# Patient Record
Sex: Male | Born: 1950
Health system: Southern US, Community
[De-identification: ages and names within clinical notes are randomized; demographics above are authoritative.]

## PROBLEM LIST (undated history)

## (undated) DIAGNOSIS — K219 Gastro-esophageal reflux disease without esophagitis: Secondary | ICD-10-CM

## (undated) DIAGNOSIS — I10 Essential (primary) hypertension: Secondary | ICD-10-CM

## (undated) DIAGNOSIS — F419 Anxiety disorder, unspecified: Secondary | ICD-10-CM

---

## 2017-07-23 DIAGNOSIS — Z125 Encounter for screening for malignant neoplasm of prostate: Secondary | ICD-10-CM | POA: Diagnosis not present

## 2017-07-23 DIAGNOSIS — I10 Essential (primary) hypertension: Secondary | ICD-10-CM | POA: Diagnosis not present

## 2017-08-31 DIAGNOSIS — R194 Change in bowel habit: Secondary | ICD-10-CM | POA: Diagnosis not present

## 2017-08-31 DIAGNOSIS — R197 Diarrhea, unspecified: Secondary | ICD-10-CM | POA: Diagnosis not present

## 2017-10-13 DIAGNOSIS — M109 Gout, unspecified: Secondary | ICD-10-CM | POA: Diagnosis not present

## 2017-10-13 DIAGNOSIS — K529 Noninfective gastroenteritis and colitis, unspecified: Secondary | ICD-10-CM | POA: Diagnosis not present

## 2017-10-13 DIAGNOSIS — K5289 Other specified noninfective gastroenteritis and colitis: Secondary | ICD-10-CM | POA: Diagnosis not present

## 2017-10-13 DIAGNOSIS — D124 Benign neoplasm of descending colon: Secondary | ICD-10-CM | POA: Diagnosis not present

## 2017-10-13 DIAGNOSIS — F17200 Nicotine dependence, unspecified, uncomplicated: Secondary | ICD-10-CM | POA: Diagnosis not present

## 2017-10-13 DIAGNOSIS — R194 Change in bowel habit: Secondary | ICD-10-CM | POA: Diagnosis not present

## 2017-10-13 DIAGNOSIS — K5730 Diverticulosis of large intestine without perforation or abscess without bleeding: Secondary | ICD-10-CM | POA: Diagnosis not present

## 2017-10-13 DIAGNOSIS — Z79899 Other long term (current) drug therapy: Secondary | ICD-10-CM | POA: Diagnosis not present

## 2017-10-13 DIAGNOSIS — I10 Essential (primary) hypertension: Secondary | ICD-10-CM | POA: Diagnosis not present

## 2018-04-28 ENCOUNTER — Encounter (HOSPITAL_BASED_OUTPATIENT_CLINIC_OR_DEPARTMENT_OTHER): Payer: Self-pay

## 2018-04-28 ENCOUNTER — Inpatient Hospital Stay (HOSPITAL_BASED_OUTPATIENT_CLINIC_OR_DEPARTMENT_OTHER)
Admission: EM | Admit: 2018-04-28 | Discharge: 2018-05-01 | DRG: 281 | Disposition: A | Discharge status: Home / Self Care | Payer: Medicare Other | Attending: Interventional Cardiology | Admitting: Interventional Cardiology

## 2018-04-28 ENCOUNTER — Ambulatory Visit (HOSPITAL_COMMUNITY): Admit: 2018-04-28 | Payer: Self-pay | Admitting: Interventional Cardiology

## 2018-04-28 ENCOUNTER — Inpatient Hospital Stay (HOSPITAL_COMMUNITY)
Admission: EM | Disposition: A | Discharge status: Home / Self Care | Payer: Self-pay | Source: Home / Self Care | Attending: Interventional Cardiology

## 2018-04-28 ENCOUNTER — Emergency Department (HOSPITAL_BASED_OUTPATIENT_CLINIC_OR_DEPARTMENT_OTHER): Payer: Medicare Other

## 2018-04-28 DIAGNOSIS — I2510 Atherosclerotic heart disease of native coronary artery without angina pectoris: Secondary | ICD-10-CM | POA: Diagnosis not present

## 2018-04-28 DIAGNOSIS — F17210 Nicotine dependence, cigarettes, uncomplicated: Secondary | ICD-10-CM | POA: Diagnosis present

## 2018-04-28 DIAGNOSIS — K219 Gastro-esophageal reflux disease without esophagitis: Secondary | ICD-10-CM | POA: Diagnosis present

## 2018-04-28 DIAGNOSIS — I2119 ST elevation (STEMI) myocardial infarction involving other coronary artery of inferior wall: Principal | ICD-10-CM | POA: Diagnosis present

## 2018-04-28 DIAGNOSIS — I472 Ventricular tachycardia: Secondary | ICD-10-CM | POA: Diagnosis not present

## 2018-04-28 DIAGNOSIS — I10 Essential (primary) hypertension: Secondary | ICD-10-CM | POA: Diagnosis not present

## 2018-04-28 DIAGNOSIS — R079 Chest pain, unspecified: Secondary | ICD-10-CM | POA: Diagnosis not present

## 2018-04-28 DIAGNOSIS — F419 Anxiety disorder, unspecified: Secondary | ICD-10-CM | POA: Diagnosis present

## 2018-04-28 DIAGNOSIS — R0602 Shortness of breath: Secondary | ICD-10-CM | POA: Diagnosis not present

## 2018-04-28 DIAGNOSIS — I213 ST elevation (STEMI) myocardial infarction of unspecified site: Secondary | ICD-10-CM

## 2018-04-28 DIAGNOSIS — E785 Hyperlipidemia, unspecified: Secondary | ICD-10-CM

## 2018-04-28 DIAGNOSIS — I441 Atrioventricular block, second degree: Secondary | ICD-10-CM | POA: Diagnosis present

## 2018-04-28 DIAGNOSIS — I340 Nonrheumatic mitral (valve) insufficiency: Secondary | ICD-10-CM | POA: Diagnosis not present

## 2018-04-28 DIAGNOSIS — F17200 Nicotine dependence, unspecified, uncomplicated: Secondary | ICD-10-CM

## 2018-04-28 DIAGNOSIS — Z8249 Family history of ischemic heart disease and other diseases of the circulatory system: Secondary | ICD-10-CM | POA: Diagnosis not present

## 2018-04-28 HISTORY — DX: Anxiety disorder, unspecified: F41.9

## 2018-04-28 HISTORY — DX: Essential (primary) hypertension: I10

## 2018-04-28 HISTORY — DX: Gastro-esophageal reflux disease without esophagitis: K21.9

## 2018-04-28 HISTORY — PX: LEFT HEART CATH AND CORONARY ANGIOGRAPHY: CATH118249

## 2018-04-28 LAB — CBC WITH DIFFERENTIAL/PLATELET
BASOS ABS: 0 10*3/uL (ref 0.0–0.1)
BASOS PCT: 0 %
EOS ABS: 0.2 10*3/uL (ref 0.0–0.7)
EOS PCT: 3 %
HCT: 40.9 % (ref 39.0–52.0)
Hemoglobin: 14.5 g/dL (ref 13.0–17.0)
Lymphocytes Relative: 41 %
Lymphs Abs: 2.8 10*3/uL (ref 0.7–4.0)
MCH: 28.7 pg (ref 26.0–34.0)
MCHC: 35.5 g/dL (ref 30.0–36.0)
MCV: 81 fL (ref 78.0–100.0)
MONO ABS: 0.6 10*3/uL (ref 0.1–1.0)
Monocytes Relative: 9 %
Neutro Abs: 3.1 10*3/uL (ref 1.7–7.7)
Neutrophils Relative %: 47 %
PLATELETS: 243 10*3/uL (ref 150–400)
RBC: 5.05 MIL/uL (ref 4.22–5.81)
RDW: 17.7 % — AB (ref 11.5–15.5)
WBC: 6.7 10*3/uL (ref 4.0–10.5)

## 2018-04-28 LAB — MRSA PCR SCREENING: MRSA by PCR: NEGATIVE

## 2018-04-28 LAB — POCT ACTIVATED CLOTTING TIME
ACTIVATED CLOTTING TIME: 180 s
ACTIVATED CLOTTING TIME: 158 s
Activated Clotting Time: 274 seconds
Activated Clotting Time: 235 seconds

## 2018-04-28 LAB — TROPONIN I
TROPONIN I: 1.05 ng/mL — AB (ref <0.03)
Troponin I: 19.2 ng/mL (ref <0.03)

## 2018-04-28 LAB — BASIC METABOLIC PANEL
Anion gap: 11 (ref 5–15)
BUN: 14 mg/dL (ref 8–23)
CO2: 23 mmol/L (ref 22–32)
Calcium: 9.5 mg/dL (ref 8.9–10.3)
Chloride: 105 mmol/L (ref 98–111)
Creatinine, Ser: 1.1 mg/dL (ref 0.61–1.24)
GFR calc Af Amer: >60 mL/min (ref >60)
GLUCOSE: 115 mg/dL — AB (ref 70–99)
Potassium: 3.7 mmol/L (ref 3.5–5.1)
Sodium: 139 mmol/L (ref 135–145)

## 2018-04-28 LAB — PROTIME-INR
INR: 1.02
PROTHROMBIN TIME: 13.3 s (ref 11.4–15.2)

## 2018-04-28 SURGERY — LEFT HEART CATH AND CORONARY ANGIOGRAPHY
Anesthesia: LOCAL

## 2018-04-28 MED ORDER — HEPARIN (PORCINE) IN NACL 100-0.45 UNIT/ML-% IJ SOLN: 800.0000 [IU]/h | INTRAMUSCULAR | Status: DC

## 2018-04-28 MED ORDER — ASPIRIN 81 MG PO CHEW
324.0000 mg | CHEWABLE_TABLET | Freq: Once | ORAL | Status: AC
  Administered 2018-04-28: 324 mg via ORAL
  Filled 2018-04-28: qty 4

## 2018-04-28 MED ORDER — SODIUM CHLORIDE 0.9 % IV SOLN: 250.0000 mL | INTRAVENOUS | Status: DC | PRN

## 2018-04-28 MED ORDER — CITALOPRAM HYDROBROMIDE 10 MG PO TABS
10.0000 mg | ORAL_TABLET | Freq: Every day | ORAL | Status: DC
  Administered 2018-04-28 – 2018-05-01 (×4): 10 mg via ORAL
  Filled 2018-04-28 (×4): qty 1

## 2018-04-28 MED ORDER — ASPIRIN 81 MG PO CHEW
81.0000 mg | CHEWABLE_TABLET | Freq: Every day | ORAL | Status: DC
  Administered 2018-04-29 – 2018-05-01 (×3): 81 mg via ORAL
  Filled 2018-04-28 (×3): qty 1

## 2018-04-28 MED ORDER — FENTANYL CITRATE (PF) 100 MCG/2ML IJ SOLN
50.0000 ug | Freq: Once | INTRAMUSCULAR | Status: AC
  Administered 2018-04-28: 50 ug via INTRAVENOUS

## 2018-04-28 MED ORDER — HEPARIN (PORCINE) IN NACL 1000-0.9 UT/500ML-% IV SOLN
INTRAVENOUS | Status: DC | PRN
  Administered 2018-04-28 (×3): 500 mL

## 2018-04-28 MED ORDER — HEPARIN SODIUM (PORCINE) 5000 UNIT/ML IJ SOLN
INTRAMUSCULAR | Status: AC
  Filled 2018-04-28: qty 1

## 2018-04-28 MED ORDER — POTASSIUM CHLORIDE CRYS ER 20 MEQ PO TBCR
40.0000 meq | EXTENDED_RELEASE_TABLET | Freq: Once | ORAL | Status: AC
  Administered 2018-04-28: 40 meq via ORAL
  Filled 2018-04-28: qty 2

## 2018-04-28 MED ORDER — LIDOCAINE HCL (PF) 1 % IJ SOLN
INTRAMUSCULAR | Status: DC | PRN
  Administered 2018-04-28: 20 mL via INTRADERMAL
  Administered 2018-04-28: 2 mL via INTRADERMAL

## 2018-04-28 MED ORDER — METOPROLOL TARTRATE 12.5 MG HALF TABLET
12.5000 mg | ORAL_TABLET | Freq: Two times a day (BID) | ORAL | Status: DC
  Administered 2018-04-30 – 2018-05-01 (×3): 12.5 mg via ORAL
  Filled 2018-04-28 (×4): qty 1

## 2018-04-28 MED ORDER — FENTANYL CITRATE (PF) 100 MCG/2ML IJ SOLN
INTRAMUSCULAR | Status: AC
Start: 2018-04-28 — End: 2018-04-28
  Filled 2018-04-28: qty 2

## 2018-04-28 MED ORDER — CLOPIDOGREL BISULFATE 75 MG PO TABS
75.0000 mg | ORAL_TABLET | Freq: Every day | ORAL | Status: DC
  Administered 2018-04-29 – 2018-05-01 (×3): 75 mg via ORAL
  Filled 2018-04-28 (×3): qty 1

## 2018-04-28 MED ORDER — NITROGLYCERIN 0.4 MG SL SUBL
SUBLINGUAL_TABLET | SUBLINGUAL | Status: AC
  Administered 2018-04-28: 0.4 mg via SUBLINGUAL
  Filled 2018-04-28: qty 3

## 2018-04-28 MED ORDER — ACETAMINOPHEN 325 MG PO TABS: 650.0000 mg | ORAL_TABLET | ORAL | Status: DC | PRN

## 2018-04-28 MED ORDER — VERAPAMIL HCL 2.5 MG/ML IV SOLN
INTRAVENOUS | Status: DC | PRN
  Administered 2018-04-28: 10 mL via INTRA_ARTERIAL

## 2018-04-28 MED ORDER — SODIUM CHLORIDE 0.9% FLUSH
3.0000 mL | INTRAVENOUS | Status: DC | PRN
  Administered 2018-04-28: 3 mL via INTRAVENOUS
  Filled 2018-04-28: qty 3

## 2018-04-28 MED ORDER — HEPARIN (PORCINE) IN NACL 100-0.45 UNIT/ML-% IJ SOLN
INTRAMUSCULAR | Status: AC
  Filled 2018-04-28: qty 250

## 2018-04-28 MED ORDER — SODIUM CHLORIDE 0.9 % IV SOLN
INTRAVENOUS | Status: AC
  Administered 2018-04-28: 16:00:00 via INTRAVENOUS

## 2018-04-28 MED ORDER — SODIUM CHLORIDE 0.9 % IV BOLUS
500.0000 mL | Freq: Once | INTRAVENOUS | Status: AC
  Administered 2018-04-28: 500 mL via INTRAVENOUS

## 2018-04-28 MED ORDER — LABETALOL HCL 5 MG/ML IV SOLN: 10.0000 mg | INTRAVENOUS | Status: AC | PRN

## 2018-04-28 MED ORDER — HYDRALAZINE HCL 20 MG/ML IJ SOLN: 5.0000 mg | INTRAMUSCULAR | Status: DC | PRN

## 2018-04-28 MED ORDER — FOLIC ACID 1 MG PO TABS
1.0000 mg | ORAL_TABLET | Freq: Every day | ORAL | Status: DC
  Administered 2018-04-28 – 2018-05-01 (×4): 1 mg via ORAL
  Filled 2018-04-28 (×4): qty 1

## 2018-04-28 MED ORDER — VITAMIN B-12 1000 MCG PO TABS
1000.0000 ug | ORAL_TABLET | Freq: Every day | ORAL | Status: DC
  Administered 2018-04-28 – 2018-05-01 (×4): 1000 ug via ORAL
  Filled 2018-04-28 (×4): qty 1

## 2018-04-28 MED ORDER — IOPAMIDOL (ISOVUE-370) INJECTION 76%
INTRAVENOUS | Status: DC | PRN
  Administered 2018-04-28: 125 mL

## 2018-04-28 MED ORDER — SODIUM CHLORIDE 0.9% FLUSH
3.0000 mL | Freq: Two times a day (BID) | INTRAVENOUS | Status: DC
  Administered 2018-04-28 – 2018-05-01 (×6): 3 mL via INTRAVENOUS

## 2018-04-28 MED ORDER — CLOPIDOGREL BISULFATE 300 MG PO TABS
ORAL_TABLET | ORAL | Status: DC | PRN
  Administered 2018-04-28: 300 mg via ORAL

## 2018-04-28 MED ORDER — HEPARIN SODIUM (PORCINE) 5000 UNIT/ML IJ SOLN
60.0000 [IU]/kg | Freq: Once | INTRAMUSCULAR | Status: AC
  Administered 2018-04-28: 3950 [IU] via INTRAVENOUS

## 2018-04-28 MED ORDER — HEPARIN (PORCINE) IN NACL 100-0.45 UNIT/ML-% IJ SOLN
12.0000 [IU]/kg/h | INTRAMUSCULAR | Status: DC
  Administered 2018-04-28: 12 [IU]/kg/h via INTRAVENOUS

## 2018-04-28 MED ORDER — ATORVASTATIN CALCIUM 80 MG PO TABS
80.0000 mg | ORAL_TABLET | Freq: Every day | ORAL | Status: DC
  Administered 2018-04-28 – 2018-04-30 (×3): 80 mg via ORAL
  Filled 2018-04-28 (×3): qty 1

## 2018-04-28 MED ORDER — ONDANSETRON HCL 4 MG/2ML IJ SOLN: 4.0000 mg | Freq: Four times a day (QID) | INTRAMUSCULAR | Status: DC | PRN

## 2018-04-28 MED ORDER — HYDRALAZINE HCL 20 MG/ML IJ SOLN: 10.0000 mg | INTRAMUSCULAR | Status: DC | PRN

## 2018-04-28 MED ORDER — HEPARIN SODIUM (PORCINE) 1000 UNIT/ML IJ SOLN
INTRAMUSCULAR | Status: DC | PRN
  Administered 2018-04-28: 7000 [IU] via INTRAVENOUS

## 2018-04-28 MED ORDER — NITROGLYCERIN 0.4 MG SL SUBL
0.4000 mg | SUBLINGUAL_TABLET | SUBLINGUAL | Status: DC | PRN
  Administered 2018-04-28 (×3): 0.4 mg via SUBLINGUAL

## 2018-04-28 MED ORDER — IOHEXOL 350 MG/ML SOLN
INTRAVENOUS | Status: DC | PRN
  Administered 2018-04-28: 160 mL

## 2018-04-28 MED ORDER — HEPARIN (PORCINE) IN NACL 100-0.45 UNIT/ML-% IJ SOLN
1150.0000 [IU]/h | INTRAMUSCULAR | Status: DC
  Administered 2018-04-28: 800 [IU]/h via INTRAVENOUS
  Administered 2018-04-29: 1150 [IU]/h via INTRAVENOUS
  Filled 2018-04-28 (×2): qty 250

## 2018-04-28 MED ORDER — HEPARIN SODIUM (PORCINE) 5000 UNIT/ML IJ SOLN: 60.0000 [IU]/kg | Freq: Once | INTRAMUSCULAR | Status: DC

## 2018-04-28 MED ORDER — MIDAZOLAM HCL 2 MG/2ML IJ SOLN
INTRAMUSCULAR | Status: DC | PRN
  Administered 2018-04-28: 1 mg via INTRAVENOUS

## 2018-04-28 SURGICAL SUPPLY — 26 items
CATH 5FR JL3.5 JR4 ANG PIG MP (CATHETERS) ×1 IMPLANT
CATH LAUNCHER 6FR AL1 (CATHETERS) ×1 IMPLANT
CATH LAUNCHER 6FR AL2 (CATHETERS) ×1 IMPLANT
CATH LAUNCHER 6FR AR1 (CATHETERS) ×2 IMPLANT
CATH VISTA GUIDE 6FR JR4 (CATHETERS) ×1 IMPLANT
CATH VISTA GUIDE 6FR XBRCA (CATHETERS) ×1 IMPLANT
CATHETER LAUNCHER 6FR AL1 (CATHETERS) ×2
CATHETER LAUNCHER 6FR AL2 (CATHETERS) ×2
CATHETER LAUNCHER 6FR MP1 (CATHETERS) ×2 IMPLANT
DEVICE RAD COMP TR BAND LRG (VASCULAR PRODUCTS) ×2 IMPLANT
GLIDESHEATH SLEND SS 6F .021 (SHEATH) ×1 IMPLANT
GUIDEWIRE INQWIRE 1.5J.035X260 (WIRE) ×1 IMPLANT
INQWIRE 1.5J .035X260CM (WIRE) ×2
KIT ENCORE 26 ADVANTAGE (KITS) ×1 IMPLANT
KIT HEART LEFT (KITS) ×2 IMPLANT
KIT HEMO VALVE WATCHDOG (MISCELLANEOUS) ×2 IMPLANT
PACK CARDIAC CATHETERIZATION (CUSTOM PROCEDURE TRAY) ×2 IMPLANT
SHEATH PINNACLE 6F 10CM (SHEATH) ×1 IMPLANT
SHEATH PROBE COVER 6X72 (BAG) ×2 IMPLANT
SYR MEDRAD MARK V 150ML (SYRINGE) ×2 IMPLANT
TRANSDUCER W/STOPCOCK (MISCELLANEOUS) ×2 IMPLANT
TUBING CIL FLEX 10 FLL-RA (TUBING) ×2 IMPLANT
WIRE ASAHI PROWATER 180CM (WIRE) ×1 IMPLANT
WIRE ASAHI PROWATER 300CM (WIRE) IMPLANT
WIRE EMERALD 3MM-J .035X150CM (WIRE) ×1 IMPLANT
WIRE HI TORQ VERSACORE-J 145CM (WIRE) ×1 IMPLANT

## 2018-04-28 NOTE — Progress Notes (Signed)
ANTICOAGULATION CONSULT NOTE - Initial Consult  Pharmacy Consult for heparin Indication: chest pain/ACS  No Known Allergies  Patient Measurements: Weight: 145 lb (65.8 kg) Heparin Dosing Weight:   Vital Signs: BP: 124/87 (07/24 0901) Pulse Rate: 67 (07/24 0901)  Labs: Recent Labs    04/28/18 0855  HGB 14.5  HCT 40.9  PLT 243    CrCl cannot be calculated (No order found.).   Medical History: Past Medical History:  Diagnosis Date  . Acid reflux   . Hypertension     Medications:  Scheduled:  . heparin      . heparin  60 Units/kg Intravenous Once    Assessment: 57 YOM presenting with CP to [place], code STEMI called and pharmacy consulted for heparin.  No known anticoagulation PTA, CBC wnl and no bleeding reported.   Goal of Therapy:  Heparin level 0.3-0.7 units/ml Monitor platelets by anticoagulation protocol: Yes   Plan:  Initiate heparin 800 units/hr IV (3950 units IV bolus given in ED) F/u heparin level at 1500 F/u cath/duration  Monitor daily heparin level, CBC, s/s bleeding  Bertis Ruddy 04/28/2018,9:07 AM

## 2018-04-28 NOTE — ED Notes (Signed)
Pt transported to [facility] cath lab via EMS and [place]; pt a/o x4 denies pain at this

## 2018-04-28 NOTE — ED Notes (Signed)
Pain 6/10

## 2018-04-28 NOTE — ED Triage Notes (Signed)
Pt states moving limbs about 51mins ago and developed center chest pain with SOB, head and hands claming and vomit x1

## 2018-04-28 NOTE — Progress Notes (Signed)
Notified Barrett PA of patient's diastolic pressure >507. PRN hydralazine ordered.

## 2018-04-28 NOTE — H&P (Addendum)
Cardiology Admission History and Physical:   Patient ID: Darius Patrick; MRN: [id number]; DOB: 06/16/1951   Admission date: 04/28/2018  Primary Care Provider: Patient, No Pcp Per Primary Cardiologist: Larae Grooms, MD  Primary Electrophysiologist:  n/c  Chief Complaint:  STEMI  Patient Profile:   Darius Patrick is a 67 y.o. male with a history of HTN, anxiety, was living in [place], [place] till about 2 weeks ago. He is moving to [place], to be closer to family.   History of Present Illness:   [name] developed chest pain about 8 am today. It started with moderate exertion. It was 10/10 and associated with SOB and N&V. Brother took him to [facility] and they transferred him to [facility] for an inferior STEMI.   He was given ASA and heparin, transferred urgently to [facility] and taken directly to the cath lab.   In the Cath Lab, his chest pain completely resolved.  He has never had chest pain like this before.  His activity level has been higher than usual because of moving.  He has generally not felt well at times, but does not describe any specific exertional symptoms.  He has no history of palpitations, orthopnea, lower extremity edema or PND.  He continues to smoke about 1/2 pack a day.  He has dyspnea on exertion that has not changed recently.  He used to drink heavily, 1/5 of liquor a day.  He tapered himself back starting in January and his alcohol use is greatly decreased from what it was.  At first, he tried to quit cold [place] and developed DTs.  He started back drinking and then began cutting back gradually.  He has no family physician, has not had a checkup in several years.   Past Medical History:  Diagnosis Date  . Acid reflux   . Anxiety   . Hypertension     Past Surgical History:  Procedure Laterality Date  . LEFT HEART CATH AND CORONARY ANGIOGRAPHY N/A 04/28/2018   Procedure: LEFT HEART CATH AND CORONARY ANGIOGRAPHY;  Surgeon: Jettie Booze, MD;  Location: [facility];  Service: Cardiovascular;  Laterality: N/A;     Medications Prior to Admission: Prior to Admission medications   Medication Sig Start Date End Date Taking? Authorizing Provider  citalopram (CELEXA) 10 MG tablet Take 10 mg by mouth daily.   Yes [provider]  folic acid (FOLVITE) 1 MG tablet Take 1 mg by mouth daily.   Yes [provider]  traZODone (DESYREL) 100 MG tablet Take 100-200 mg by mouth at bedtime.   Yes [provider]  vitamin B-12 (CYANOCOBALAMIN) 1000 MCG tablet Take 1,000 mcg by mouth daily.   Yes [provider]     Allergies:   No Known Allergies  Social History:   Social History   Socioeconomic History  . Marital status: Divorced    Spouse name: Not on file  . Number of children: Not on file  . Years of education: Not on file  . Highest education level: Not on file  Occupational History  . Occupation: Retired Forensic scientist  . Financial resource strain: Not on file  . Food insecurity:    Worry: Not on file    Inability: Not on file  . Transportation needs:    Medical: Not on file    Non-medical: Not on file  Tobacco Use  . Smoking status: Current Every Day Smoker    Packs/day: 0.50  . Smokeless tobacco:  Never Used  Substance and Sexual Activity  . Alcohol use: Yes    Alcohol/week: 3.6 oz    Types: 3 Cans of beer, 3 Glasses of wine per week    Comment: a few beers and a pint of wine a week.  . Drug use: Not Currently    Comment: not since the 70s  . Sexual activity: Not on file  Lifestyle  . Physical activity:    Days per week: Not on file    Minutes per session: Not on file  . Stress: Not on file  Relationships  . Social connections:    Talks on phone: Not on file    Gets together: Not on file    Attends religious service: Not on file    Active member of club or organization: Not on file    Attends meetings of clubs or organizations: Not on file    Relationship status: Not  on file  . Intimate partner violence:    Fear of current or ex partner: Not on file    Emotionally abused: Not on file    Physically abused: Not on file    Forced sexual activity: Not on file  Other Topics Concern  . Not on file  Social History Narrative   Pt lives alone in [place], [place].     Family History:   The patient's family history includes Heart attack (age of onset: 33) in his father; Heart disease in his brother; Hypertension in his sister; Hypertension (age of onset: 25) in his mother.    The patient indicated that his mother is deceased. He indicated that his father is deceased. He indicated that his sister is alive. He indicated that both of his brothers are alive.   ROS:  Please see the history of present illness.  All other ROS reviewed and negative.     Physical Exam/Data:   Vitals:   04/28/18 1615 04/28/18 1630 04/28/18 1700 04/28/18 1800  BP:   (!) 137/105 120/73  Pulse: 60  (!) 56   Resp: 14 18 10 20   Temp:      TempSrc:      SpO2: 98%  99%   Weight:        Intake/Output Summary (Last 24 hours) at 04/28/2018 1820 Last data filed at 04/28/2018 1200 Gross per 24 hour  Intake -  Output 600 ml  Net -600 ml   Filed Weights   04/28/18 0858  Weight: 145 lb (65.8 kg)   There is no height or weight on file to calculate BMI.  General:  Well nourished, well developed, in no acute distress HEENT: normal except for disconjugate gaze Lymph: no adenopathy Neck: no JVD Endocrine:  No thryomegaly Vascular: No carotid bruits; FA pulses 2+ bilaterally without bruits  Cardiac:  normal S1, S2; RRR; no murmur  Lungs: Slight expiratory wheeze bilaterally, no rhonchi or rales  Abd: soft, nontender, no hepatomegaly  Ext: no edema, right groin cath site and right radial cath site are without ecchymosis or hematoma Musculoskeletal:  No deformities, BUE and BLE strength normal and equal Skin: warm and dry  Neuro:  CNs 2-12 intact, no focal abnormalities noted Psych:   Normal affect    EKG:  The ECG that was done today was personally reviewed and demonstrates sinus rhythm, heart rate 83, inferior ST elevation and lateral T wave changes  Relevant CV Studies: Echo, ordered  Heart Cath 04/28/2018  Occluded RCA, not selectively visualized. Heavily calcified and could not be engaged.  Left to right collaterals.  Mild to moderate heavily calcific CAD in the left system.  LV end diastolic pressure is normal.  There is no aortic valve stenosis.  Thoracic aorta appeared to show high takeoff of RCA. No dissection noted.  Severe tortuosity of the right subclavian artery with moderate atherosclerosis noted.  Recommend uninterrupted dual antiplatelet therapy with Aspirin 81mg  daily and Clopidogrel 75mg  daily for a minimum of 12 months (ACS - Class I recommendation).   Start beta blocker, statin along with aggressive secondary prevention.    Watch in ICU.  Heparin for 24-48 hours after sheath pull.  Laboratory Data:  Chemistry Recent Labs  Lab 04/28/18 0855  NA 139  K 3.7  CL 105  CO2 23  GLUCOSE 115*  BUN 14  CREATININE 1.10  CALCIUM 9.5  GFRNONAA >60  GFRAA >60  ANIONGAP 11    Hematology Recent Labs  Lab 04/28/18 0855  WBC 6.7  RBC 5.05  HGB 14.5  HCT 40.9  MCV 81.0  MCH 28.7  MCHC 35.5  RDW 17.7*  PLT 243   Cardiac Enzymes Recent Labs  Lab 04/28/18 0855 04/28/18 1456  TROPONINI 1.05* 19.20*   Radiology/Studies:  Dg Chest Portable 1 View  Result Date: 04/28/2018 CLINICAL DATA:  Chest pain, shortness of breath, diaphoresis and single episode of vomiting. EXAM: PORTABLE CHEST 1 VIEW COMPARISON:  Right shoulder dated 10/27/2011. FINDINGS: Normal sized heart. Tortuous aorta. Minimal linear density in both lower lung zones. Mildly prominent pulmonary vasculature and interstitial markings. Minimal dextroconvex thoracic scoliosis. Thoracic spine degenerative changes and minimal bilateral glenohumeral joint degenerative changes.  Old, healed right 6th rib fracture. IMPRESSION: 1. Mild pulmonary vascular congestion. 2. Mild interstitial lung disease, most likely chronic. 3. Minimal bibasilar linear atelectasis or scarring. Electronically Signed   By: Claudie Revering M.D.   On: 04/28/2018 09:19    Assessment and Plan:   1. Inferior STEMI: He was taken to the Cath Lab and had an occluded RCA, could not be crossed with a wire and medical therapy is recommended.  He is on aspirin, Plavix, currently on heparin.  He was started on a beta-blocker, but the initial dose was held because of a heart rate in the 50s.  He is on high-dose statin.  An echocardiogram is ordered. 2. Hypertension: His blood pressure is currently well controlled.  Beta-blocker has been added, hopefully will be able to give a dose tonight.  If not, consider ACE inhibitor or ARB tomorrow 3. Unknown lipid status: He has been started on high-dose statin.  Check LFTs and lipid profile tomorrow 4. Tobacco use: Cessation advised   Severity of Illness: The appropriate patient status for this patient is INPATIENT. Inpatient status is judged to be reasonable and necessary in order to provide the required intensity of service to ensure the patient's safety. The patient's presenting symptoms, physical exam findings, and initial radiographic and laboratory data in the context of their chronic comorbidities is felt to place them at high risk for further clinical deterioration. Furthermore, it is not anticipated that the patient will be medically stable for discharge from the hospital within 2 midnights of admission. The following factors support the patient status of inpatient.   " The patient's presenting symptoms include chest pain. " The worrisome physical exam findings include abnormal ECG. " The initial radiographic and laboratory data are worrisome because of occluded RCA. " The chronic co-morbidities include hypertension.   * I certify that at the point of admission  it is  my clinical judgment that the patient will require inpatient hospital care spanning beyond 2 midnights from the point of admission due to high intensity of service, high risk for further deterioration and high frequency of surveillance required.*    For questions or updates, please contact [place] Please consult [email or web address] for contact info under Cardiology/STEMI.    Signed, Rosaria Ferries, PA-C  04/28/2018 6:20 PM   I have examined the patient and reviewed assessment and plan and discussed with patient.  Agree with above as stated.  I personally made the decision for emergency cardiac catheterization.  Cardiac cath revealed left to right collaterals.  It was suspected that he had an occluded right coronary artery.  Unfortunately, we are unable to engage the RCA despite trying multiple guide catheters.  He had a heavily calcified RCA.  He was pain-free at the end of the procedure.  Will continue medical therapy.  Larae Grooms

## 2018-04-28 NOTE — ED Provider Notes (Signed)
[facility] EMERGENCY DEPARTMENT Provider Note   CSN: [id number] Arrival date & time: 04/28/18  0831     History   Chief Complaint Chief Complaint  Patient presents with  . Chest Pain    HPI Darius Patrick is a 67 y.o. male.  67 year old male with past medical history including anxiety, tobacco use who presents with chest pain.  Just prior to arrival, he was cutting up branches of a fallen tree when he suddenly began having central chest pressure that became severe and was associated with shortness of breath, vomiting, and diaphoresis.  His pain is still present but has eased off a bit, currently 7/10 in intensity.  Pain is nonradiating and he has never had anything like this before.  No recent long travel.  He denies any cardiac history.  Family history notable for father with MI in his 62s.  Patient denies any drug use, problems with bleeding, black or bloody stools.  The history is provided by the patient.  Chest Pain      Past Medical History:  Diagnosis Date  . Acid reflux   . Hypertension     There are no active problems to display for this patient.  PMH: Anxiety  PSH: Denies    Home Medications    Prior to Admission medications   Not on File    Family History No family history on file. Father- died MI in his 23s  Social History Social History   Tobacco Use  . Smoking status: Never Smoker  . Smokeless tobacco: Never Used  Substance Use Topics  . Alcohol use: Yes  . Drug use: Not Currently  1/2PPD smoking 2 beers every other day Denies drug use Lived in [place], moving here to [place] full time   Allergies   Patient has no known allergies.   Review of Systems Review of Systems  Cardiovascular: Positive for chest pain.   All other systems reviewed and are negative except that which was mentioned in HPI   Physical Exam Updated Vital Signs BP 124/87 (BP Location: Right Arm)   Pulse 67   Resp 16   Wt 65.8 kg (145 lb)   Physical Exam    Constitutional: He is oriented to person, place, and time. He appears well-developed and well-nourished. No distress.  HENT:  Head: Normocephalic and atraumatic.  Moist mucous membranes  Eyes: Conjunctivae are normal.  Neck: Neck supple.  Cardiovascular: Normal rate, regular rhythm and normal heart sounds.  No murmur heard. Pulmonary/Chest: Effort normal and breath sounds normal.  Abdominal: Soft. Bowel sounds are normal. He exhibits no distension. There is no tenderness.  Musculoskeletal: He exhibits no edema.  Neurological: He is alert and oriented to person, place, and time.  Fluent speech  Skin: Skin is warm and dry.  Psychiatric: He has a normal mood and affect. Judgment normal.  Nursing note and vitals reviewed.    ED Treatments / Results  Labs (all labs ordered are listed, but only abnormal results are displayed) Labs Reviewed  BASIC METABOLIC PANEL  CBC WITH DIFFERENTIAL/PLATELET  PROTIME-INR  TROPONIN I    EKG EKG Interpretation  Date/Time:  Wednesday April 28 2018 08:40:37 EDT Ventricular Rate:  50 PR Interval:    QRS Duration: 107 QT Interval:  507 QTC Calculation: 463 R Axis:   -22 Text Interpretation:  Sinus rhythm Probable left atrial enlargement Borderline left axis deviation Repol abnrm suggests ischemia, anterolateral ST elevation, consider inferior injury No previous ECGs available concerning borderline ST elevation/Twave changes  inferiorly and laterally, needs repeat Confirmed by Theotis Burrow [phone]) on 04/28/2018 8:44:18 AM   Radiology No results found.  Procedures .Critical Care Performed by: Sharlett Iles, MD Authorized by: Sharlett Iles, MD   Critical care provider statement:    Critical care time (minutes):  30   Critical care time was exclusive of:  Separately billable procedures and treating other patients   Critical care was necessary to treat or prevent imminent or life-threatening deterioration of the following  conditions:  Cardiac failure   Critical care was time spent personally by me on the following activities:  Development of treatment plan with patient or surrogate, discussions with consultants, evaluation of patient's response to treatment, examination of patient, obtaining history from patient or surrogate, ordering and performing treatments and interventions, ordering and review of laboratory studies, ordering and review of radiographic studies and re-evaluation of patient's condition   (including critical care time)  Medications Ordered in ED Medications  nitroGLYCERIN (NITROSTAT) SL tablet 0.4 mg (0.4 mg Sublingual Given 04/28/18 0902)  heparin injection 3,950 Units (has no administration in time range)  heparin ADULT infusion 100 units/mL (25000 units/255mL sodium chloride 0.45%) (has no administration in time range)  heparin 5000 UNIT/ML injection (has no administration in time range)  heparin 100-0.45 UNIT/ML-% infusion (has no administration in time range)  aspirin chewable tablet 324 mg (324 mg Oral Given 04/28/18 0850)     Initial Impression / Assessment and Plan / ED Course  I have reviewed the triage vital signs and the nursing notes.  Pertinent labs & imaging results that were available during my care of the patient were reviewed by me and considered in my medical decision making (see chart for details).     VS stable on arrival. Pt non-toxic on exam.  Initial EKG with concerning inferior changes with borderline ST elevation in an inverted T waves, borderline reciprocal depression in aVL.  Repeat 5 minutes later shows evolving inferior STEMI.  Called code STEMI and discussed with Dr. Irish Lack, who has advised heparin bolus and drip along with aspirin, hold plavix for now. Pt denies bleeding hx. Pt emergently transferred to [facility] for heart catherization. Final Clinical Impressions(s) / ED Diagnoses   Final diagnoses:  ST elevation myocardial infarction (STEMI), unspecified artery  [facility])    ED Discharge Orders    None       Nile Prisk, Wenda Overland, MD 04/28/18 [phone]

## 2018-04-28 NOTE — Plan of Care (Signed)
  Problem: Education: Goal: Knowledge of General Education information will improve Description Including pain rating scale, medication(s)/side effects and non-pharmacologic comfort measures Outcome: Progressing   Problem: Clinical Measurements: Goal: Respiratory complications will improve Outcome: Progressing   Problem: Nutrition: Goal: Adequate nutrition will be maintained Outcome: Progressing   Problem: Elimination: Goal: Will not experience complications related to urinary retention Outcome: Progressing   Problem: Pain Managment: Goal: General experience of comfort will improve Outcome: Progressing Note:  Patient not complaining of chest pain since cath lab visit.   Problem: Cardiac: Goal: Vascular access site(s) Level 0-1 will be maintained Outcome: Progressing

## 2018-04-28 NOTE — Progress Notes (Signed)
CRITICAL VALUE ALERT  Critical Value:  Troponin 19.2  Date & Time Notied:  04/28/2018 @ 1700  Provider Notified: Barett PA  Orders Received/Actions taken: No orders received at this time

## 2018-04-28 NOTE — Progress Notes (Signed)
ANTICOAGULATION CONSULT NOTE - [facility] for heparin Indication: chest pain/ACS  No Known Allergies  Patient Measurements: Weight: 145 lb (65.8 kg) Heparin Dosing Weight:   Vital Signs: Temp: 98.4 F (36.9 C) (07/24 0835) Temp Source: Oral (07/24 0835) BP: 139/90 (07/24 1205) Pulse Rate: 60 (07/24 1205)  Labs: Recent Labs    04/28/18 0855  HGB 14.5  HCT 40.9  PLT 243  LABPROT 13.3  INR 1.02  CREATININE 1.10  TROPONINI 1.05*    CrCl cannot be calculated (Unknown ideal weight.).   Medical History: Past Medical History:  Diagnosis Date  . Acid reflux   . Hypertension     Medications:  Scheduled:  . [START ON 04/29/2018] aspirin  81 mg Oral Daily  . atorvastatin  80 mg Oral q1800  . [START ON 04/29/2018] clopidogrel  75 mg Oral Q breakfast  . fentaNYL      . metoprolol tartrate  12.5 mg Oral BID  . sodium chloride flush  3 mL Intravenous Q12H    Assessment: 66yoM admitted as code STEMI found to have fully occluded RCA. PCI unable to be completed due to anatomy, IV heparin to continue for 24-48 hr for medical management. Pharmacy consulted to resume IV heparin 8hr after sheath pulled (~1400 per RN).   Goal of Therapy:  Heparin level 0.3-0.7 units/ml Monitor platelets by anticoagulation protocol: Yes   Plan:  -Resume IV heparin 800 units/hr with no bolus at 2000 -Check 6hr heparin level -F/U LOT IV heparin therapy  Arrie Senate, PharmD, BCPS Clinical Pharmacist [phone] Please check AMION for all [facility] numbers 04/28/2018

## 2018-04-28 NOTE — Progress Notes (Signed)
Paged Barrett PA about patient's rhythm. Varies from SB 50s to NSR 70s with frequent PVCs/runs of VT. Ordered 40 mEq K po and to draw a magnesium level in the morning. Will continue to monitor patient closely.

## 2018-04-29 ENCOUNTER — Other Ambulatory Visit: Payer: Self-pay

## 2018-04-29 ENCOUNTER — Other Ambulatory Visit (HOSPITAL_COMMUNITY): Payer: Medicaid - Out of State

## 2018-04-29 DIAGNOSIS — I472 Ventricular tachycardia: Secondary | ICD-10-CM

## 2018-04-29 LAB — CBC
HCT: 40.6 % (ref 39.0–52.0)
Hemoglobin: 13.2 g/dL (ref 13.0–17.0)
MCH: 27.6 pg (ref 26.0–34.0)
MCHC: 32.5 g/dL (ref 30.0–36.0)
MCV: 84.9 fL (ref 78.0–100.0)
Platelets: 222 10*3/uL (ref 150–400)
RBC: 4.78 MIL/uL (ref 4.22–5.81)
RDW: 17.4 % — ABNORMAL HIGH (ref 11.5–15.5)
WBC: 7.4 10*3/uL (ref 4.0–10.5)

## 2018-04-29 LAB — BASIC METABOLIC PANEL
Anion gap: 11 (ref 5–15)
BUN: 9 mg/dL (ref 8–23)
CO2: 21 mmol/L — ABNORMAL LOW (ref 22–32)
CREATININE: 1.18 mg/dL (ref 0.61–1.24)
Calcium: 9.2 mg/dL (ref 8.9–10.3)
Chloride: 104 mmol/L (ref 98–111)
GFR calc non Af Amer: >60 mL/min (ref >60)
Glucose, Bld: 84 mg/dL (ref 70–99)
Potassium: 4.2 mmol/L (ref 3.5–5.1)
SODIUM: 136 mmol/L (ref 135–145)

## 2018-04-29 LAB — HEPATIC FUNCTION PANEL
ALBUMIN: 3.3 g/dL — AB (ref 3.5–5.0)
ALK PHOS: 52 U/L (ref 38–126)
ALT: 37 U/L (ref 0–44)
AST: 201 U/L — ABNORMAL HIGH (ref 15–41)
Bilirubin, Direct: 0.1 mg/dL (ref 0.0–0.2)
Indirect Bilirubin: 0.6 mg/dL (ref 0.3–0.9)
TOTAL PROTEIN: 6.7 g/dL (ref 6.5–8.1)
Total Bilirubin: 0.7 mg/dL (ref 0.3–1.2)

## 2018-04-29 LAB — LIPID PANEL
CHOL/HDL RATIO: 2.9 ratio
CHOLESTEROL: 152 mg/dL (ref 0–200)
HDL: 52 mg/dL (ref >40)
LDL Cholesterol: 85 mg/dL (ref 0–99)
TRIGLYCERIDES: 73 mg/dL (ref <150)
VLDL: 15 mg/dL (ref 0–40)

## 2018-04-29 LAB — MAGNESIUM: MAGNESIUM: 1 mg/dL — AB (ref 1.7–2.4)

## 2018-04-29 LAB — TROPONIN I: Troponin I: >65 ng/mL (ref <0.03)

## 2018-04-29 LAB — HEPARIN LEVEL (UNFRACTIONATED)
HEPARIN UNFRACTIONATED: 0.28 [IU]/mL — AB (ref 0.30–0.70)
Heparin Unfractionated: 0.15 IU/mL — ABNORMAL LOW (ref 0.30–0.70)
Heparin Unfractionated: 0.51 IU/mL (ref 0.30–0.70)

## 2018-04-29 MED ORDER — AMIODARONE LOAD VIA INFUSION
150.0000 mg | Freq: Once | INTRAVENOUS | Status: AC
  Administered 2018-04-29: 150 mg via INTRAVENOUS
  Filled 2018-04-29: qty 83.34

## 2018-04-29 MED ORDER — AMIODARONE HCL IN DEXTROSE 360-4.14 MG/200ML-% IV SOLN
60.0000 mg/h | INTRAVENOUS | Status: AC
  Administered 2018-04-29 (×2): 60 mg/h via INTRAVENOUS
  Filled 2018-04-29 (×2): qty 200

## 2018-04-29 MED ORDER — AMIODARONE HCL IN DEXTROSE 360-4.14 MG/200ML-% IV SOLN
30.0000 mg/h | INTRAVENOUS | Status: DC
  Administered 2018-04-29: 30 mg/h via INTRAVENOUS
  Filled 2018-04-29: qty 200

## 2018-04-29 MED ORDER — MAGNESIUM OXIDE 400 (241.3 MG) MG PO TABS
200.0000 mg | ORAL_TABLET | Freq: Every day | ORAL | Status: DC
  Administered 2018-04-29 – 2018-05-01 (×3): 200 mg via ORAL
  Filled 2018-04-29 (×3): qty 1

## 2018-04-29 MED ORDER — MAGNESIUM SULFATE 2 GM/50ML IV SOLN
2.0000 g | Freq: Once | INTRAVENOUS | Status: AC
  Administered 2018-04-29: 2 g via INTRAVENOUS
  Filled 2018-04-29: qty 50

## 2018-04-29 NOTE — Progress Notes (Signed)
Progress Note  Patient Name: Darius Patrick Date of Encounter: 04/29/2018  Primary Cardiologist: Larae Grooms, MD   Subjective   No chest pain this am. Loletha Grayer overnight with frequent runs of NSVT  Inpatient Medications    Scheduled Meds: . aspirin  81 mg Oral Daily  . atorvastatin  80 mg Oral q1800  . citalopram  10 mg Oral Daily  . clopidogrel  75 mg Oral Q breakfast  . folic acid  1 mg Oral Daily  . metoprolol tartrate  12.5 mg Oral BID  . sodium chloride flush  3 mL Intravenous Q12H  . vitamin B-12  1,000 mcg Oral Daily   Continuous Infusions: . sodium chloride    . heparin 1,000 Units/hr (04/29/18 0600)   PRN Meds: sodium chloride, acetaminophen, hydrALAZINE, nitroGLYCERIN, ondansetron (ZOFRAN) IV, sodium chloride flush   Vital Signs    Vitals:   04/29/18 0400 04/29/18 0420 04/29/18 0500 04/29/18 0600  BP: 139/78  (!) 123/96 125/77  Pulse: 63  (!) 56 (!) 53  Resp: (!) 22  12 14   Temp:  97.9 F (36.6 C)    TempSrc:  Oral    SpO2: 96%  100% 100%  Weight:        Intake/Output Summary (Last 24 hours) at 04/29/2018 0745 Last data filed at 04/29/2018 0600 Gross per 24 hour  Intake 577.05 ml  Output 875 ml  Net -297.95 ml   Filed Weights   04/28/18 0858  Weight: 145 lb (65.8 kg)    Telemetry    Sinus, NSVT noted on monitor overnight and this am - Personally Reviewed  ECG    Sinus brady, inferior Q waves- Personally Reviewed  Physical Exam   GEN: No acute distress.   Neck: No JVD Cardiac: RRR, no murmurs, rubs, or gallops.  Respiratory: Clear to auscultation bilaterally. GI: Soft, nontender, non-distended  MS: No LE edema; No deformity. Neuro:  Nonfocal  Psych: Normal affect   Labs    Chemistry Recent Labs  Lab 04/28/18 0855 04/29/18 0202  NA 139 136  K 3.7 4.2  CL 105 104  CO2 23 21*  GLUCOSE 115* 84  BUN 14 9  CREATININE 1.10 1.18  CALCIUM 9.5 9.2  PROT  --  6.7  ALBUMIN  --  3.3*  AST  --  201*  ALT  --  37  ALKPHOS  --  52   BILITOT  --  0.7  GFRNONAA >60 >60  GFRAA >60 >60  ANIONGAP 11 11     Hematology Recent Labs  Lab 04/28/18 0855 04/29/18 0202  WBC 6.7 7.4  RBC 5.05 4.78  HGB 14.5 13.2  HCT 40.9 40.6  MCV 81.0 84.9  MCH 28.7 27.6  MCHC 35.5 32.5  RDW 17.7* 17.4*  PLT 243 222    Cardiac Enzymes Recent Labs  Lab 04/28/18 0855 04/28/18 1456 04/28/18 2128 04/29/18 0202  TROPONINI 1.05* 19.20* >65.00* >65.00*   No results for input(s): TROPIPOC in the last 168 hours.   BNPNo results for input(s): BNP, PROBNP in the last 168 hours.   DDimer No results for input(s): DDIMER in the last 168 hours.   Radiology    Dg Chest Portable 1 View  Result Date: 04/28/2018 CLINICAL DATA:  Chest pain, shortness of breath, diaphoresis and single episode of vomiting. EXAM: PORTABLE CHEST 1 VIEW COMPARISON:  Right shoulder dated 10/27/2011. FINDINGS: Normal sized heart. Tortuous aorta. Minimal linear density in both lower lung zones. Mildly prominent pulmonary vasculature and interstitial markings.  Minimal dextroconvex thoracic scoliosis. Thoracic spine degenerative changes and minimal bilateral glenohumeral joint degenerative changes. Old, healed right 6th rib fracture. IMPRESSION: 1. Mild pulmonary vascular congestion. 2. Mild interstitial lung disease, most likely chronic. 3. Minimal bibasilar linear atelectasis or scarring. Electronically Signed   By: Claudie Revering M.D.   On: 04/28/2018 09:19    Cardiac Studies   Cardiac cath 04/28/18:  Occluded RCA, not selectively visualized. Heavily calcified and could not be engaged. Left to right collaterals.  Mild to moderate heavily calcific CAD in the left system.  LV end diastolic pressure is normal.  There is no aortic valve stenosis.  Thoracic aorta appeared to show high takeoff of RCA. No dissection noted.  Severe tortuosity of the right subclavian artery with moderate atherosclerosis noted.  Diagnostic Diagram       Implants    No implant  documentation for this case.  MERGE Images   Show images for CARDIAC CATHETERIZATION   Link to Procedure Log   Procedure Log    Hemo Data    Most Recent Value  AO Systolic Pressure 387 mmHg  AO Diastolic Pressure 72 mmHg  AO Mean 93 mmHg  LV Systolic Pressure 564 mmHg  LV Diastolic Pressure 8 mmHg  LV EDP 16 mmHg  AOp Systolic Pressure 332 mmHg  AOp Diastolic Pressure 75 mmHg  AOp Mean Pressure 951 mmHg  LVp Systolic Pressure 884 mmHg  LVp Diastolic Pressure 5 mmHg  LVp EDP Pressure 14 mmHg  Order-Level Documents:   There are no order-level documents.  Encounter-Level Documents - 04/28/2018:   Scan on 04/28/2018 8:29 PM by Default, Provider, MDScan on 04/28/2018 8:29 PM by Default, Provider, MD  Scan on 04/28/2018 11:34 AM by Default, Provider, MDScan on 04/28/2018 11:34 AM by Default, Provider, MD  Electronic signature on 04/28/2018 9:13 AM - Signed  Scan on 04/28/2018 9:08 AM by Default, Provider, MDScan on 04/28/2018 9:08 AM by Default, Provider, MD  Document on 04/28/2018 9:06 AM by Rex Kras, Wenda Overland, MD: ED PB Billing Extract      Signed   Electronically signed by Jettie Booze, MD on 04/28/18 at 1137 EDT  External Result Report   External Result Report     Patient Profile     67 y.o. male with history of HTN, tobacco abuse admitted to [facility] 04/28/18 with an inferior STEMI. The RCA was occluded but could not be engaged and was seen to fill from left to right collaterals. No PCI was done as he was chest pain free.   Assessment & Plan    1. CAD/Inferior STEMI: His RCA occlusion was acute with peak troponin of 65. Unable to engage the RCA which was already filling from left to right collaterals. Stable this am. Plan for medical management of CAD. No chest pain this am. Will continue ASA, Plavix, statin and beta blocker. Echo today to assess LVEF  2. Tobacco abuse: Smoking cessation recommended.   3. HTN: BP controlled.   4. NSVT: Cannot titrate beta blocker  further given his bradycardia. Will load with amiodarone this am for frequent NSVT  Will monitor in ICU today  For questions or updates, please contact [place] Please consult [email or web address] for contact info under Cardiology/STEMI.      Signed, Lauree Chandler, MD  04/29/2018, 7:45 AM

## 2018-04-29 NOTE — Progress Notes (Signed)
ANTICOAGULATION CONSULT NOTE - Follow-Up Consult  Pharmacy Consult for heparin Indication: chest pain/ACS  No Known Allergies  Patient Measurements: Weight: 145 lb (65.8 kg) Heparin Dosing Weight:   Vital Signs: Temp: 98.3 F (36.8 C) (07/24 2344) Temp Source: Oral (07/24 2344) BP: 131/91 (07/25 0200) Pulse Rate: 59 (07/25 0200)  Labs: Recent Labs    04/28/18 0855 04/28/18 1456 04/28/18 2128 04/29/18 0202  HGB 14.5  --   --  13.2  HCT 40.9  --   --  40.6  PLT 243  --   --  222  LABPROT 13.3  --   --   --   INR 1.02  --   --   --   HEPARINUNFRC  --   --   --  0.15*  CREATININE 1.10  --   --   --   TROPONINI 1.05* 19.20* >65.00*  --     CrCl cannot be calculated (Unknown ideal weight.).   Medical History: Past Medical History:  Diagnosis Date  . Acid reflux   . Anxiety   . Hypertension     Medications:  Scheduled:  . aspirin  81 mg Oral Daily  . atorvastatin  80 mg Oral q1800  . citalopram  10 mg Oral Daily  . clopidogrel  75 mg Oral Q breakfast  . folic acid  1 mg Oral Daily  . metoprolol tartrate  12.5 mg Oral BID  . sodium chloride flush  3 mL Intravenous Q12H  . vitamin B-12  1,000 mcg Oral Daily    Assessment: 66yoM admitted as code STEMI found to have fully occluded RCA. PCI unable to be completed due to anatomy, IV heparin to continue for 24-48 hr for medical management. Pharmacy consulted to resume IV heparin 8hr after sheath pulled (~1400 per RN).  Heparin level 0.15 units/ml  Goal of Therapy:  Heparin level 0.3-0.7 units/ml Monitor platelets by anticoagulation protocol: Yes   Plan:  -Increase IV heparin to 1000 units/hr -Check 6hr heparin level -F/U LOT IV heparin therapy  Thanks for allowing pharmacy to be a part of this patient's care.  Excell Seltzer, PharmD Clinical Pharmacist

## 2018-04-29 NOTE — Progress Notes (Signed)
ANTICOAGULATION CONSULT NOTE - Follow-Up Consult  Pharmacy Consult for heparin Indication: chest pain/ACS  No Known Allergies  Patient Measurements: Height: 5\' 8"  (172.7 cm) Weight: 145 lb (65.8 kg) IBW/kg (Calculated) : 68.4 Heparin Dosing Weight:   65.8 kg   Vital Signs: Temp: 98 F (36.7 C) (07/25 1623) Temp Source: Oral (07/25 1623) BP: 116/97 (07/25 1800) Pulse Rate: 66 (07/25 1800)  Labs: Recent Labs    04/28/18 0855 04/28/18 1456 04/28/18 2128 04/29/18 0202 04/29/18 1047 04/29/18 1802  HGB 14.5  --   --  13.2  --   --   HCT 40.9  --   --  40.6  --   --   PLT 243  --   --  222  --   --   LABPROT 13.3  --   --   --   --   --   INR 1.02  --   --   --   --   --   HEPARINUNFRC  --   --   --  0.15* 0.28* 0.51  CREATININE 1.10  --   --  1.18  --   --   TROPONINI 1.05* 19.20* >65.00* >65.00*  --   --     Estimated Creatinine Clearance: 58.1 mL/min (by C-G formula based on SCr of 1.18 mg/dL).   Medical History: Past Medical History:  Diagnosis Date  . Acid reflux   . Anxiety   . Hypertension     Medications:  Scheduled:  . aspirin  81 mg Oral Daily  . atorvastatin  80 mg Oral q1800  . citalopram  10 mg Oral Daily  . clopidogrel  75 mg Oral Q breakfast  . folic acid  1 mg Oral Daily  . magnesium oxide  200 mg Oral Daily  . metoprolol tartrate  12.5 mg Oral BID  . sodium chloride flush  3 mL Intravenous Q12H  . vitamin B-12  1,000 mcg Oral Daily    Assessment: Darius Patrick admitted as code STEMI found to have fully occluded RCA. PCI unable to be completed due to anatomy, IV heparin to continue for 24-48 hr for medical management. Pharmacy consulted to manage heparin therapy.   Heparin level this evening came back therapeutic at 0.51, on 1150 units/hr. Hgb 13.2, plt 222. No s/sx of bleeding. No infusion issues.   Goal of Therapy:  Heparin level 0.3-0.7 units/ml Monitor platelets by anticoagulation protocol: Yes   Plan:  -Continue IV heparin at 1150  units/hr -Monitor daily HL, CBC, and for s/sx of bleeding -F/U LOT IV heparin therapy  Thank you for allowing pharmacy to be a part of this patient's care.  Doylene Canard, PharmD Clinical Pharmacist  Pager: [phone] Phone: [phone] 04/29/2018 7:49 PM

## 2018-04-29 NOTE — Progress Notes (Signed)
CARDIAC REHAB PHASE I   Discussed MI, restrictions, and smoking cessation with pt. Good reception. He sts he quit drinking 1/2 gallon liquor a day 6 months ago and now he wants to quit smoking. Thankful for fake cigarette and resources. Will need reinforcement. Left diet sheet and video for him to view. Will f/u tomorrow for ambulation and more education. I assisted him to [place], ACSM 04/29/2018 2:49 PM

## 2018-04-29 NOTE — Progress Notes (Signed)
ANTICOAGULATION CONSULT NOTE - Follow-Up Consult  Pharmacy Consult for heparin Indication: chest pain/ACS  No Known Allergies  Patient Measurements: Height: 5\' 8"  (172.7 cm) Weight: 145 lb (65.8 kg) IBW/kg (Calculated) : 68.4 Heparin Dosing Weight:   65.8 kg   Vital Signs: Temp: 97.8 F (36.6 C) (07/25 0900) Temp Source: Oral (07/25 0900) BP: 119/81 (07/25 1000) Pulse Rate: 62 (07/25 1000)  Labs: Recent Labs    04/28/18 0855 04/28/18 1456 04/28/18 2128 04/29/18 0202 04/29/18 1047  HGB 14.5  --   --  13.2  --   HCT 40.9  --   --  40.6  --   PLT 243  --   --  222  --   LABPROT 13.3  --   --   --   --   INR 1.02  --   --   --   --   HEPARINUNFRC  --   --   --  0.15* 0.28*  CREATININE 1.10  --   --  1.18  --   TROPONINI 1.05* 19.20* >65.00* >65.00*  --     Estimated Creatinine Clearance: 58.1 mL/min (by C-G formula based on SCr of 1.18 mg/dL).   Medical History: Past Medical History:  Diagnosis Date  . Acid reflux   . Anxiety   . Hypertension     Medications:  Scheduled:  . aspirin  81 mg Oral Daily  . atorvastatin  80 mg Oral q1800  . citalopram  10 mg Oral Daily  . clopidogrel  75 mg Oral Q breakfast  . folic acid  1 mg Oral Daily  . metoprolol tartrate  12.5 mg Oral BID  . sodium chloride flush  3 mL Intravenous Q12H  . vitamin B-12  1,000 mcg Oral Daily    Assessment: 66yoM admitted as code STEMI found to have fully occluded RCA. PCI unable to be completed due to anatomy, IV heparin to continue for 24-48 hr for medical management. Pharmacy consulted to manage heparin therapy.   Heparin level slightly subtherapeutic at 0.28 units/ml on heparin 1000 units/hr. H/H stable, no bleeding noted.  Goal of Therapy:  Heparin level 0.3-0.7 units/ml Monitor platelets by anticoagulation protocol: Yes   Plan:  -Increase IV heparin to 1150 units/hr -Check 6 hr heparin level @ 1800 -F/U LOT IV heparin therapy  Thank you for allowing pharmacy to be a part of  this patient's care.  Claiborne Billings, PharmD PGY2 Cardiology Pharmacy Resident Phone [phone] 04/29/2018 11:29 AM

## 2018-04-29 NOTE — Progress Notes (Signed)
   Went on call last p.m., Mr. Lank was having nonsustained VT.  He has since been started on amiodarone. I had ordered a magnesium level and it came back low at 1.0. I will give him 2 g IV and start 200 mg daily.  Recheck in a.m.  Rosaria Ferries, PA-C 04/29/2018 4:04 PM

## 2018-04-30 ENCOUNTER — Inpatient Hospital Stay (HOSPITAL_COMMUNITY): Payer: Medicare Other

## 2018-04-30 DIAGNOSIS — I340 Nonrheumatic mitral (valve) insufficiency: Secondary | ICD-10-CM

## 2018-04-30 DIAGNOSIS — I10 Essential (primary) hypertension: Secondary | ICD-10-CM

## 2018-04-30 LAB — ECHOCARDIOGRAM COMPLETE
Height: 68 in
WEIGHTICAEL: 2393.6 [oz_av]

## 2018-04-30 LAB — CBC
HCT: 36.6 % — ABNORMAL LOW (ref 39.0–52.0)
HEMATOCRIT: 35.4 % — AB (ref 39.0–52.0)
HEMOGLOBIN: 11.6 g/dL — AB (ref 13.0–17.0)
Hemoglobin: 12 g/dL — ABNORMAL LOW (ref 13.0–17.0)
MCH: 27.8 pg (ref 26.0–34.0)
MCH: 28.2 pg (ref 26.0–34.0)
MCHC: 32.8 g/dL (ref 30.0–36.0)
MCHC: 32.8 g/dL (ref 30.0–36.0)
MCV: 84.9 fL (ref 78.0–100.0)
MCV: 86.1 fL (ref 78.0–100.0)
PLATELETS: 205 10*3/uL (ref 150–400)
Platelets: 196 10*3/uL (ref 150–400)
RBC: 4.17 MIL/uL — ABNORMAL LOW (ref 4.22–5.81)
RBC: 4.25 MIL/uL (ref 4.22–5.81)
RDW: 17.7 % — AB (ref 11.5–15.5)
RDW: 17.2 % — AB (ref 11.5–15.5)
WBC: 6.4 10*3/uL (ref 4.0–10.5)
WBC: 6.9 10*3/uL (ref 4.0–10.5)

## 2018-04-30 LAB — BASIC METABOLIC PANEL
Anion gap: 12 (ref 5–15)
BUN: 13 mg/dL (ref 8–23)
CALCIUM: 9 mg/dL (ref 8.9–10.3)
CHLORIDE: 103 mmol/L (ref 98–111)
CO2: 18 mmol/L — ABNORMAL LOW (ref 22–32)
CREATININE: 1.04 mg/dL (ref 0.61–1.24)
Glucose, Bld: 81 mg/dL (ref 70–99)
Potassium: 4.7 mmol/L (ref 3.5–5.1)
Sodium: 133 mmol/L — ABNORMAL LOW (ref 135–145)

## 2018-04-30 LAB — HEPARIN LEVEL (UNFRACTIONATED): HEPARIN UNFRACTIONATED: 0.35 [IU]/mL (ref 0.30–0.70)

## 2018-04-30 LAB — MAGNESIUM: MAGNESIUM: 1.3 mg/dL — AB (ref 1.7–2.4)

## 2018-04-30 MED ORDER — MAGNESIUM SULFATE 2 GM/50ML IV SOLN
2.0000 g | Freq: Once | INTRAVENOUS | Status: AC
  Administered 2018-04-30: 2 g via INTRAVENOUS
  Filled 2018-04-30: qty 50

## 2018-04-30 MED FILL — Nitroglycerin IV Soln 100 MCG/ML in D5W: INTRA_ARTERIAL | Qty: 10 | Status: AC

## 2018-04-30 NOTE — Progress Notes (Signed)
Nutrition Brief Note  Patient identified on the Malnutrition Screening Tool (MST) Report  Wt Readings from Last 15 Encounters:  04/30/18 149 lb 9.6 oz (67.9 kg)    Body mass index is 22.75 kg/m. Pt with normal BMI  Current diet order is Heart Healthy patient is consuming approximately 100% of meals at this time. Labs and medications reviewed.  Noted plan for possible discharge tomorrow. No nutrition interventions warranted at this time. If nutrition issues arise, please consult RD.   Kerman Passey MS, RD, [place], [place] [phone] Pager  [phone] Weekend/On-Call Pager

## 2018-04-30 NOTE — Progress Notes (Signed)
Progress Note  Patient Name: Darius Patrick Date of Encounter: 04/30/2018  Primary Cardiologist: Larae Grooms, MD   Subjective   No chest pain or dyspnea. He feels great.  No NSVT over last 18 hours  Inpatient Medications    Scheduled Meds: . aspirin  81 mg Oral Daily  . atorvastatin  80 mg Oral q1800  . citalopram  10 mg Oral Daily  . clopidogrel  75 mg Oral Q breakfast  . folic acid  1 mg Oral Daily  . magnesium oxide  200 mg Oral Daily  . metoprolol tartrate  12.5 mg Oral BID  . sodium chloride flush  3 mL Intravenous Q12H  . vitamin B-12  1,000 mcg Oral Daily   Continuous Infusions: . sodium chloride    . amiodarone 30 mg/hr (04/30/18 0200)  . heparin 1,150 Units/hr (04/30/18 0200)   PRN Meds: sodium chloride, acetaminophen, hydrALAZINE, nitroGLYCERIN, ondansetron (ZOFRAN) IV, sodium chloride flush   Vital Signs    Vitals:   04/30/18 0400 04/30/18 0500 04/30/18 0600 04/30/18 0700  BP: 99/72 116/73 119/80   Pulse: 60 60 (!) 56   Resp: 14 10 15    Temp:      TempSrc:      SpO2: 100% 100% 100%   Weight:    149 lb 9.6 oz (67.9 kg)  Height:        Intake/Output Summary (Last 24 hours) at 04/30/2018 0751 Last data filed at 04/30/2018 0200 Gross per 24 hour  Intake 1307.38 ml  Output 452 ml  Net 855.38 ml   Filed Weights   04/28/18 0858 04/30/18 0700  Weight: 145 lb (65.8 kg) 149 lb 9.6 oz (67.9 kg)    Telemetry    Sinus- Personally Reviewed  ECG    No AM EKG- Personally Reviewed  Physical Exam   General: Well developed, well nourished, NAD  HEENT: OP clear, mucus membranes moist  SKIN: warm, dry. No rashes. Neuro: No focal deficits  Musculoskeletal: Muscle strength 5/5 all ext  Psychiatric: Mood and affect normal  Neck: No JVD, no carotid bruits, no thyromegaly, no lymphadenopathy.  Lungs:Clear bilaterally, no wheezes, rhonci, crackles Cardiovascular: Regular rate and rhythm. No murmurs, gallops or rubs. Abdomen:Soft. Bowel sounds present.  Non-tender.  Extremities: No lower extremity edema. Pulses are 2 + in the bilateral DP/PT.   Labs    Chemistry Recent Labs  Lab 04/28/18 0855 04/29/18 0202 04/30/18 0227  NA 139 136 133*  K 3.7 4.2 4.7  CL 105 104 103  CO2 23 21* 18*  GLUCOSE 115* 84 81  BUN 14 9 13   CREATININE 1.10 1.18 1.04  CALCIUM 9.5 9.2 9.0  PROT  --  6.7  --   ALBUMIN  --  3.3*  --   AST  --  201*  --   ALT  --  37  --   ALKPHOS  --  52  --   BILITOT  --  0.7  --   GFRNONAA >60 >60 >60  GFRAA >60 >60 >60  ANIONGAP 11 11 12      Hematology Recent Labs  Lab 04/29/18 0202 04/29/18 2359 04/30/18 0227  WBC 7.4 6.9 6.4  RBC 4.78 4.17* 4.25  HGB 13.2 11.6* 12.0*  HCT 40.6 35.4* 36.6*  MCV 84.9 84.9 86.1  MCH 27.6 27.8 28.2  MCHC 32.5 32.8 32.8  RDW 17.4* 17.2* 17.7*  PLT 222 196 205    Cardiac Enzymes Recent Labs  Lab 04/28/18 0855 04/28/18 1456 04/28/18 2128 04/29/18  0202  TROPONINI 1.05* 19.20* >65.00* >65.00*   No results for input(s): TROPIPOC in the last 168 hours.   BNPNo results for input(s): BNP, PROBNP in the last 168 hours.   DDimer No results for input(s): DDIMER in the last 168 hours.   Radiology    Dg Chest Portable 1 View  Result Date: 04/28/2018 CLINICAL DATA:  Chest pain, shortness of breath, diaphoresis and single episode of vomiting. EXAM: PORTABLE CHEST 1 VIEW COMPARISON:  Right shoulder dated 10/27/2011. FINDINGS: Normal sized heart. Tortuous aorta. Minimal linear density in both lower lung zones. Mildly prominent pulmonary vasculature and interstitial markings. Minimal dextroconvex thoracic scoliosis. Thoracic spine degenerative changes and minimal bilateral glenohumeral joint degenerative changes. Old, healed right 6th rib fracture. IMPRESSION: 1. Mild pulmonary vascular congestion. 2. Mild interstitial lung disease, most likely chronic. 3. Minimal bibasilar linear atelectasis or scarring. Electronically Signed   By: Claudie Revering M.D.   On: 04/28/2018 09:19     Cardiac Studies   Cardiac cath 04/28/18:  Occluded RCA, not selectively visualized. Heavily calcified and could not be engaged. Left to right collaterals.  Mild to moderate heavily calcific CAD in the left system.  LV end diastolic pressure is normal.  There is no aortic valve stenosis.  Thoracic aorta appeared to show high takeoff of RCA. No dissection noted.  Severe tortuosity of the right subclavian artery with moderate atherosclerosis noted.  Diagnostic Diagram          Patient Profile     67 y.o. male with history of HTN, tobacco abuse admitted to [facility] 04/28/18 with an inferior STEMI. The RCA was occluded but could not be engaged and was seen to fill from left to right collaterals. The RCA was presumed to be occluded proximally. No PCI was done as he was chest pain free and the vessel could not be engaged.   Assessment & Plan    1. CAD/Inferior STEMI: His RCA occlusion was acute with peak troponin of 65. Unable to engage the RCA which was already filling from left to right collaterals and the patient was pain free so no PCI performed.  He is stable today. No chest pain. Will continue DAPT for one year with ASA and Plavix. Continue statin and beta blocker. Echo is still pending to assess LVEF. ker. Echo today to assess LVEF Will stop heparin today.   2. Tobacco abuse: Smoking cessation encouraged.   3. HTN: His BP is well controlled.   4. NSVT: He has had no NSVT over last 18 hours. Will d/c amiodarone drip  Transfer to tele. Likely d/c home on Saturday if stable    For questions or updates, please contact [place] Please consult [email or web address] for contact info under Cardiology/STEMI.      Signed, Lauree Chandler, MD  04/30/2018, 7:51 AM

## 2018-04-30 NOTE — Progress Notes (Signed)
CARDIAC REHAB PHASE I   PRE:  Rate/Rhythm: 28 SB  BP:  Sitting: 106/78      SaO2: 100 RA  MODE:  Ambulation: 370 ft   POST:  Rate/Rhythm: 64 SR  BP:  Sitting: 111/72    SaO2: 97 RA   Pt ambulated 329ft in hallway standby assist. Pt states he feels a little unsteady on his feet. No c.o CP or SOB. Pt has no questions on MI booklet at this time. Reinforced need for ASA, plavix, and NTG. Pt states understanding. Pt given heart healthy diet. Reviewed restrictions and exercise guidelines. Will send referral for CRP II to [place].   [id number] Rufina Falco, RN BSN 04/30/2018 10:35 AM

## 2018-04-30 NOTE — Progress Notes (Signed)
  Echocardiogram 2D Echocardiogram has been performed.  Merrie Roof F 04/30/2018, 11:18 AM

## 2018-05-01 DIAGNOSIS — E785 Hyperlipidemia, unspecified: Secondary | ICD-10-CM

## 2018-05-01 DIAGNOSIS — F17200 Nicotine dependence, unspecified, uncomplicated: Secondary | ICD-10-CM

## 2018-05-01 DIAGNOSIS — I2510 Atherosclerotic heart disease of native coronary artery without angina pectoris: Secondary | ICD-10-CM

## 2018-05-01 DIAGNOSIS — K219 Gastro-esophageal reflux disease without esophagitis: Secondary | ICD-10-CM | POA: Diagnosis present

## 2018-05-01 DIAGNOSIS — I10 Essential (primary) hypertension: Secondary | ICD-10-CM | POA: Diagnosis present

## 2018-05-01 LAB — CBC
HCT: 34 % — ABNORMAL LOW (ref 39.0–52.0)
Hemoglobin: 11.4 g/dL — ABNORMAL LOW (ref 13.0–17.0)
MCH: 27.9 pg (ref 26.0–34.0)
MCHC: 33.5 g/dL (ref 30.0–36.0)
MCV: 83.3 fL (ref 78.0–100.0)
PLATELETS: 200 10*3/uL (ref 150–400)
RBC: 4.08 MIL/uL — ABNORMAL LOW (ref 4.22–5.81)
RDW: 17.2 % — ABNORMAL HIGH (ref 11.5–15.5)
WBC: 5.6 10*3/uL (ref 4.0–10.5)

## 2018-05-01 LAB — BASIC METABOLIC PANEL
Anion gap: 8 (ref 5–15)
BUN: 11 mg/dL (ref 8–23)
CALCIUM: 9.1 mg/dL (ref 8.9–10.3)
CO2: 24 mmol/L (ref 22–32)
CREATININE: 1.06 mg/dL (ref 0.61–1.24)
Chloride: 102 mmol/L (ref 98–111)
GFR calc Af Amer: >60 mL/min (ref >60)
Glucose, Bld: 88 mg/dL (ref 70–99)
Potassium: 4.1 mmol/L (ref 3.5–5.1)
SODIUM: 134 mmol/L — AB (ref 135–145)

## 2018-05-01 LAB — MAGNESIUM: MAGNESIUM: 1.4 mg/dL — AB (ref 1.7–2.4)

## 2018-05-01 MED ORDER — MAGNESIUM OXIDE 400 (241.3 MG) MG PO TABS: 200.0000 mg | ORAL_TABLET | Freq: Every day | ORAL | 1 refills | Status: DC

## 2018-05-01 MED ORDER — CLOPIDOGREL BISULFATE 75 MG PO TABS: 75.0000 mg | ORAL_TABLET | Freq: Every day | ORAL | 3 refills | Status: AC

## 2018-05-01 MED ORDER — NITROGLYCERIN 0.4 MG SL SUBL: 0.4000 mg | SUBLINGUAL_TABLET | SUBLINGUAL | 3 refills | Status: DC | PRN

## 2018-05-01 MED ORDER — METOPROLOL TARTRATE 25 MG PO TABS: 12.5000 mg | ORAL_TABLET | Freq: Two times a day (BID) | ORAL | 3 refills | Status: AC

## 2018-05-01 MED ORDER — ATORVASTATIN CALCIUM 80 MG PO TABS: 80.0000 mg | ORAL_TABLET | Freq: Every day | ORAL | 3 refills | Status: DC

## 2018-05-01 MED ORDER — ATORVASTATIN CALCIUM 80 MG PO TABS: 80.0000 mg | ORAL_TABLET | Freq: Every day | ORAL | 3 refills | Status: AC

## 2018-05-01 MED ORDER — ASPIRIN EC 81 MG PO TBEC: 81.0000 mg | DELAYED_RELEASE_TABLET | Freq: Every day | ORAL | 3 refills | Status: AC

## 2018-05-01 MED ORDER — ASPIRIN EC 81 MG PO TBEC: 81.0000 mg | DELAYED_RELEASE_TABLET | Freq: Every day | ORAL | 3 refills | Status: DC

## 2018-05-01 MED ORDER — METOPROLOL TARTRATE 25 MG PO TABS: 12.5000 mg | ORAL_TABLET | Freq: Two times a day (BID) | ORAL | 3 refills | Status: DC

## 2018-05-01 MED ORDER — NITROGLYCERIN 0.4 MG SL SUBL: 0.4000 mg | SUBLINGUAL_TABLET | SUBLINGUAL | 3 refills | Status: AC | PRN

## 2018-05-01 MED ORDER — CLOPIDOGREL BISULFATE 75 MG PO TABS: 75.0000 mg | ORAL_TABLET | Freq: Every day | ORAL | 3 refills | Status: DC

## 2018-05-01 NOTE — Discharge Summary (Addendum)
Discharge Summary    Patient ID: Gunner Iodice,  MRN: [id number], DOB/AGE: April 07, 1951 67 y.o.  Admit date: 04/28/2018 Discharge date: 05/01/2018  Primary Care Provider: Patient, No Pcp Per Primary Cardiologist: Larae Grooms, MD  Discharge Diagnoses    Principal Problem:   Acute ST elevation myocardial infarction (STEMI) of inferior wall [facility]) Active Problems:   Hypertension   Acid reflux   Hyperlipidemia LDL goal <70   CAD (coronary artery disease)   Smoker   Allergies No Known Allergies  Diagnostic Studies/Procedures    Left heart cath 04/28/18:  Occluded RCA, not selectively visualized. Heavily calcified and could not be engaged. Left to right collaterals.  Mild to moderate heavily calcific CAD in the left system.  LV end diastolic pressure is normal.  There is no aortic valve stenosis.  Thoracic aorta appeared to show high takeoff of RCA. No dissection noted.  Severe tortuosity of the right subclavian artery with moderate atherosclerosis noted.   Recommend uninterrupted dual antiplatelet therapy with Aspirin 81mg  daily and Clopidogrel 75mg  daily for a minimum of 12 months (ACS - Class I recommendation).   Start beta blocker, statin along with aggressive secondary prevention.    Watch in ICU.  Heparin for 24-48 hours after sheath pull.   Echo 04/30/18: Study Conclusions: - Left ventricle: The cavity size was normal. Wall thickness was   increased in a pattern of mild LVH. Systolic function was normal.   The estimated ejection fraction was in the range of 50% to 55%.   There is hypokinesis of the inferolateral and inferior   myocardium. Features are consistent with a pseudonormal left   ventricular filling pattern, with concomitant abnormal relaxation   and increased filling pressure (grade 2 diastolic dysfunction). - Mitral valve: Calcified annulus. There was mild regurgitation. - Left atrium: The atrium was mildly dilated. - Pericardium,  extracardiac: A trivial pericardial effusion was   identified.  Impressions: - Hypokinesis of the basal inferior and inferolateral walls with   overall low normal LV systolic function; mild LVH; moderate   diastolic dysfunction; mild LAE; mild MR and TR.   History of Present Illness     Mr. Patty developed chest pain about 8 am 04/28/18. It started with moderate exertion. It was 10/10 and associated with SOB and N&V. Brother took him to [facility] and they transferred him to [facility] for an inferior STEMI. He was given ASA and heparin, transferred urgently to [facility] and taken directly to the cath lab.   In the Cath Lab, his chest pain completely resolved.  He has never had chest pain like this before. His activity level has been higher than usual because of moving.  He has generally not felt well at times, but does not describe any specific exertional symptoms. He has no history of palpitations, orthopnea, lower extremity edema or PND. He continues to smoke about 1/2 pack a day.  He has dyspnea on exertion that has not changed recently.  He used to drink heavily, 1/5 of liquor a day.  He tapered himself back starting in January and his alcohol use is greatly decreased from what it was.  At first, he tried to quit cold [place] and developed DTs.  He started back drinking and then began cutting back gradually.  He has no family physician, has not had a checkup in several years.  Hospital Course     Consultants: none  Inferior STEMI, CAD He was taken to the cath lab for PCI and found to  have acute RCA occlusion with peak troponin of 65. Unable to engage RCA, collaterals left-to-right. No PCI. Chest pain resolved. Pt tolerated the procedure well.  Recommend ASA and plavix x 12 months. Continue statin and beta blocker.  Echo with normal LVEF.   2:1 AV block on EKG 04/28/18 Telemetry reviewed with Dr. Curt Bears. Heart block has resolved. Thought to be due to RCA occlusion.  NSVT Noted on 04/29/18.  Amiodarone drip started. Mg was 1.0. NSVT resolved and amio was stopped. No further NSVT noted on telemetry.  HTN Pressures are well-controlled.   Tobacco abuse Discussed cessation.  HLD with LDL goal less than 70 04/29/2018: Cholesterol 152; HDL 52; LDL Cholesterol 85; Triglycerides 73; VLDL 15 Continue statin.    _____________  Discharge Vitals Blood pressure 107/78, pulse 67, temperature 98.5 F (36.9 C), temperature source Oral, resp. rate 17, height 5\' 8"  (1.727 m), weight 151 lb 11.2 oz (68.8 kg), SpO2 98 %.  Filed Weights   04/28/18 0858 04/30/18 0700 05/01/18 0510  Weight: 145 lb (65.8 kg) 149 lb 9.6 oz (67.9 kg) 151 lb 11.2 oz (68.8 kg)    Labs & Radiologic Studies    CBC Recent Labs    04/30/18 0227 05/01/18 0354  WBC 6.4 5.6  HGB 12.0* 11.4*  HCT 36.6* 34.0*  MCV 86.1 83.3  PLT 205 371   Basic Metabolic Panel Recent Labs    04/30/18 0227 05/01/18 0354  NA 133* 134*  K 4.7 4.1  CL 103 102  CO2 18* 24  GLUCOSE 81 88  BUN 13 11  CREATININE 1.04 1.06  CALCIUM 9.0 9.1  MG 1.3* 1.4*   Liver Function Tests Recent Labs    04/29/18 0202  AST 201*  ALT 37  ALKPHOS 52  BILITOT 0.7  PROT 6.7  ALBUMIN 3.3*   No results for input(s): LIPASE, AMYLASE in the last 72 hours. Cardiac Enzymes Recent Labs    04/28/18 1456 04/28/18 2128 04/29/18 0202  TROPONINI 19.20* >65.00* >65.00*   BNP Invalid input(s): POCBNP D-Dimer No results for input(s): DDIMER in the last 72 hours. Hemoglobin A1C No results for input(s): HGBA1C in the last 72 hours. Fasting Lipid Panel Recent Labs    04/29/18 0202  CHOL 152  HDL 52  LDLCALC 85  TRIG 73  CHOLHDL 2.9   Thyroid Function Tests No results for input(s): TSH, T4TOTAL, T3FREE, THYROIDAB in the last 72 hours.  Invalid input(s): FREET3 _____________  Dg Chest Portable 1 View  Result Date: 04/28/2018 CLINICAL DATA:  Chest pain, shortness of breath, diaphoresis and single episode of vomiting. EXAM:  PORTABLE CHEST 1 VIEW COMPARISON:  Right shoulder dated 10/27/2011. FINDINGS: Normal sized heart. Tortuous aorta. Minimal linear density in both lower lung zones. Mildly prominent pulmonary vasculature and interstitial markings. Minimal dextroconvex thoracic scoliosis. Thoracic spine degenerative changes and minimal bilateral glenohumeral joint degenerative changes. Old, healed right 6th rib fracture. IMPRESSION: 1. Mild pulmonary vascular congestion. 2. Mild interstitial lung disease, most likely chronic. 3. Minimal bibasilar linear atelectasis or scarring. Electronically Signed   By: Claudie Revering M.D.   On: 04/28/2018 09:19   Disposition   Pt is being discharged home today in good condition.  Follow-up Plans & Appointments     Discharge Instructions    AMB Referral to Cardiac Rehabilitation - Phase II   Complete by:  As directed    Diagnosis:  STEMI   Amb Referral to Cardiac Rehabilitation   Complete by:  As directed    Madesyn Ast rend  referral to Cardiac Rehab Phase 2 [place]   Diagnosis:  STEMI   Diet - low sodium heart healthy   Complete by:  As directed    Discharge instructions   Complete by:  As directed    No driving for 1 week. No lifting over 5 lbs for 1 week. No sexual activity for 1 week. You may return to work in 1 week. Keep procedure site clean & dry. If you notice increased pain, swelling, bleeding or pus, call/return!  You may shower, but no soaking baths/hot tubs/pools for 1 week.   Increase activity slowly   Complete by:  As directed       Discharge Medications   Allergies as of 05/01/2018   No Known Allergies     Medication List    TAKE these medications   aspirin EC 81 MG tablet Take 1 tablet (81 mg total) by mouth daily.   atorvastatin 80 MG tablet Commonly known as:  LIPITOR Take 1 tablet (80 mg total) by mouth daily at 6 PM.   citalopram 10 MG tablet Commonly known as:  CELEXA Take 10 mg by mouth daily.   clopidogrel 75 MG tablet Commonly known as:   PLAVIX Take 1 tablet (75 mg total) by mouth daily with breakfast. Start taking on:  04/11/8674   folic acid 1 MG tablet Commonly known as:  FOLVITE Take 1 mg by mouth daily.   magnesium oxide 400 (241.3 Mg) MG tablet Commonly known as:  MAG-OX Take 0.5 tablets (200 mg total) by mouth daily. Start taking on:  05/02/2018   metoprolol tartrate 25 MG tablet Commonly known as:  LOPRESSOR Take 0.5 tablets (12.5 mg total) by mouth 2 (two) times daily.   nitroGLYCERIN 0.4 MG SL tablet Commonly known as:  NITROSTAT Place 1 tablet (0.4 mg total) under the tongue every 5 (five) minutes x 3 doses as needed for chest pain.   traZODone 100 MG tablet Commonly known as:  DESYREL Take 100-200 mg by mouth at bedtime.   vitamin B-12 1000 MCG tablet Commonly known as:  CYANOCOBALAMIN Take 1,000 mcg by mouth daily.        Acute coronary syndrome (MI, NSTEMI, STEMI, etc) this admission?: Yes.     AHA/ACC Clinical Performance & Quality Measures: 1. Aspirin prescribed? - Yes 2. ADP Receptor Inhibitor (Plavix/Clopidogrel, Brilinta/Ticagrelor or Effient/Prasugrel) prescribed (includes medically managed patients)? - Yes 3. Beta Blocker prescribed? - Yes 4. High Intensity Statin (Lipitor 40-80mg  or Crestor 20-40mg ) prescribed? - Yes 5. EF assessed during THIS hospitalization? - Yes 6. For EF <40%, was ACEI/ARB prescribed? - Not Applicable (EF >/= 44%) 7. For EF <40%, Aldosterone Antagonist (Spironolactone or Eplerenone) prescribed? - Not Applicable (EF >/= 92%) 8. Cardiac Rehab Phase II ordered (Included Medically managed Patients)? - Yes     Outstanding Labs/Studies   none  Duration of Discharge Encounter   Greater than 30 minutes including physician time.  Signed, Tami Lin Duke, PA 05/01/2018, 9:00 AM   I have seen and examined this patient with Fabian Sharp.  Agree with above, note added to reflect my findings.  On exam, RRR, no murmurs, lungs clear.  Presented with inferior STEMI.   Could not engage RCA and thus no stent was placed.  Patient did have left to right collaterals.  Had some nonsustained VT as well as 2-1 AV block in the original post STEMI course.  He has been stable since then.  Plan for discharge with follow-up in clinic.  Joannie Medine M. Antigone Crowell  MD 05/01/2018 9:01 AM

## 2018-05-01 NOTE — Discharge Instructions (Signed)
Heart Attack A heart attack (myocardial infarction, MI) causes damage to the heart that cannot be fixed. A heart attack often happens when a blood clot or other blockage cuts blood flow to the heart. When this happens, certain areas of the heart begin to die. This causes the pain you feel during a heart attack. Follow these instructions at home:  Take medicine as told by your doctor. You may need medicine to: ? Keep your blood from clotting too easily. ? Control your blood pressure. ? Lower your cholesterol. ? Control abnormal heart rhythms.  Change certain behaviors as told by your doctor. This may include: ? Quitting smoking. ? Being active. ? Eating a heart-healthy diet. Ask your doctor for help with this diet. ? Keeping a healthy weight. ? Keeping your diabetes under control. ? Lessening stress. ? Limiting how much alcohol you drink. Do not take these medicines unless your doctor says that you can:  Nonsteroidal anti-inflammatory drugs (NSAIDs). These include: ? Ibuprofen. ? Naproxen. ? Celecoxib.  Vitamin supplements that have vitamin A, vitamin E, or both.  Hormone therapy that contains estrogen with or without progestin.  Get help right away if:  You have sudden chest discomfort.  You have sudden discomfort in your: ? Arms. ? Back. ? Neck. ? Jaw.  You have shortness of breath at any time.  You have sudden sweating or clammy skin.  You feel sick to your stomach (nauseous) or throw up (vomit).  You suddenly get light-headed or dizzy.  You feel your heart beating fast or skipping beats. These symptoms may be an emergency. Do not wait to see if the symptoms will go away. Get medical help right away. Call your local emergency services (911 in the [place] Do not drive yourself to the hospital. This information is not intended to replace advice given to you by your health care provider. Make sure you discuss any questions you have with your health care  provider. Document Released: 03/23/2012 Document Revised: 02/28/2016 Document Reviewed: 11/25/2013 Elsevier Interactive Patient Education  2017 [place] After This sheet gives you information about how to care for yourself after your procedure. Your doctor may also give you more specific instructions. If you have problems or questions, contact your doctor. Follow these instructions at home: Insertion site care  Follow instructions from your doctor about how to take care of your long, thin tube (catheter) insertion area. Make sure you: ? Wash your hands with soap and water before you change your bandage (dressing). If you cannot use soap and water, use hand sanitizer. ? Change your bandage as told by your doctor. ? Leave stitches (sutures), skin glue, or skin tape (adhesive) strips in place. They may need to stay in place for 2 weeks or longer. If tape strips get loose and curl up, you may trim the loose edges. Do not remove tape strips completely unless your doctor says it is okay.  Do not take baths, swim, or use a hot tub until your doctor says it is okay.  You may shower 24-48 hours after the procedure or as told by your doctor. ? Gently wash the area with plain soap and water. ? Pat the area dry with a clean towel. ? Do not rub the area. This may cause bleeding.  Do not apply powder or lotion to the area. Keep the area clean and dry.  Check your insertion area every day for signs of infection. Check for: ? More redness, swelling,  or pain. ? Fluid or blood. ? Warmth. ? Pus or a bad smell. Activity  Rest as told by your doctor, usually for 1-2 days.  Do not lift anything that is heavier than 10 lbs. (4.5 kg) or as told by your doctor.  Do not drive for 24 hours if you were given a medicine to help you relax (sedative).  Do not drive or use heavy machinery while taking prescription pain medicine. General instructions  Go back to your normal  activities as told by your doctor, usually in about a week. Ask your doctor what activities are safe for you.  If the insertion area starts to bleed, lie flat and put pressure on the area. If the bleeding does not stop, get help right away. This is an emergency.  Drink enough fluid to keep your pee (urine) clear or pale yellow.  Take over-the-counter and prescription medicines only as told by your doctor.  Keep all follow-up visits as told by your doctor. This is important. Contact a doctor if:  You have a fever.  You have chills.  You have more redness, swelling, or pain around your insertion area.  You have fluid or blood coming from your insertion area.  The insertion area feels warm to the touch.  You have pus or a bad smell coming from your insertion area.  You have more bruising around the insertion area.  Blood collects in the tissue around the insertion area (hematoma) that may be painful to the touch. Get help right away if:  You have a lot of pain in the insertion area.  The insertion area swells very fast.  The insertion area is bleeding, and the bleeding does not stop after holding steady pressure on the area.  The area near or just beyond the insertion area becomes pale, cool, tingly, or numb. These symptoms may be an emergency. Do not wait to see if the symptoms will go away. Get medical help right away. Call your local emergency services (911 in the [place] Do not drive yourself to the hospital. Summary  After the procedure, it is common to have bruising and tenderness at the long, thin tube insertion area.  After the procedure, it is important to rest and drink plenty of fluids.  Do not take baths, swim, or use a hot tub until your doctor says it is okay to do so. You may shower 24-48 hours after the procedure or as told by your doctor.  If the insertion area starts to bleed, lie flat and put pressure on the area. If the bleeding does not stop, get help  right away. This is an emergency. This information is not intended to replace advice given to you by your health care provider. Make sure you discuss any questions you have with your health care provider. Document Released: 12/19/2008 Document Revised: 09/16/2016 Document Reviewed: 09/16/2016 Elsevier Interactive Patient Education  2017 [place] Heart-healthy meal planning includes:  Limiting unhealthy fats.  Increasing healthy fats.  Making other small dietary changes.  You may need to talk with your doctor or a diet specialist (dietitian) to create an eating plan that is right for you. What types of fat should I choose?  Choose healthy fats. These include olive oil and canola oil, flaxseeds, walnuts, almonds, and seeds.  Eat more omega-3 fats. These include salmon, mackerel, sardines, tuna, flaxseed oil, and ground flaxseeds. Try to eat fish at least twice each week.  Limit saturated fats. ?  Saturated fats are often found in animal products, such as meats, butter, and cream. ? Plant sources of saturated fats include palm oil, palm kernel oil, and coconut oil.  Avoid foods with partially hydrogenated oils in them. These include stick margarine, some tub margarines, cookies, crackers, and other baked goods. These contain trans fats. What general guidelines do I need to follow?  Check food labels carefully. Identify foods with trans fats or high amounts of saturated fat.  Fill one half of your plate with vegetables and green salads. Eat 4-5 servings of vegetables per day. A serving of vegetables is: ? 1 cup of raw leafy vegetables. ?  cup of raw or cooked cut-up vegetables. ?  cup of vegetable juice.  Fill one fourth of your plate with whole grains. Look for the word "whole" as the first word in the ingredient list.  Fill one fourth of your plate with lean protein foods.  Eat 4-5 servings of fruit per day. A serving of fruit is: ? One medium  whole fruit. ?  cup of dried fruit. ?  cup of fresh, frozen, or canned fruit. ?  cup of 100% fruit juice.  Eat more foods that contain soluble fiber. These include apples, broccoli, carrots, beans, peas, and barley. Try to get 20-30 g of fiber per day.  Eat more home-cooked food. Eat less restaurant, buffet, and fast food.  Limit or avoid alcohol.  Limit foods high in starch and sugar.  Avoid fried foods.  Avoid frying your food. Try baking, boiling, grilling, or broiling it instead. You can also reduce fat by: ? Removing the skin from poultry. ? Removing all visible fats from meats. ? Skimming the fat off of stews, soups, and gravies before serving them. ? Steaming vegetables in water or broth.  Lose weight if you are overweight.  Eat 4-5 servings of nuts, legumes, and seeds per week: ? One serving of dried beans or legumes equals  cup after being cooked. ? One serving of nuts equals 1 ounces. ? One serving of seeds equals  ounce or one tablespoon.  You may need to keep track of how much salt or sodium you eat. This is especially true if you have high blood pressure. Talk with your doctor or dietitian to get more information. What foods can I eat? Grains Breads, including [place], white, pita, wheat, raisin, rye, oatmeal, and [place]. Tortillas that are neither fried nor made with lard or trans fat. Low-fat rolls, including hotdog and hamburger buns and English muffins. Biscuits. Muffins. Waffles. Pancakes. Light popcorn. Whole-grain cereals. Flatbread. Melba toast. Pretzels. Breadsticks. Rusks. Low-fat snacks. Low-fat crackers, including oyster, saltine, matzo, graham, animal, and rye. Rice and pasta, including brown rice and pastas that are made with whole wheat. Vegetables All vegetables. Fruits All fruits, but limit coconut. Meats and Other Protein Sources Lean, well-trimmed beef, veal, pork, and lamb. Chicken and [place] without skin. All fish and shellfish. Wild duck,  rabbit, pheasant, and venison. Egg whites or low-cholesterol egg substitutes. Dried beans, peas, lentils, and tofu. Seeds and most nuts. Dairy Low-fat or nonfat cheeses, including ricotta, string, and mozzarella. Skim or 1% milk that is liquid, powdered, or evaporated. Buttermilk that is made with low-fat milk. Nonfat or low-fat yogurt. Beverages Mineral water. Diet carbonated beverages. Sweets and Desserts Sherbets and fruit ices. Honey, jam, marmalade, jelly, and syrups. Meringues and gelatins. Pure sugar candy, such as hard candy, jelly beans, gumdrops, mints, marshmallows, and small amounts of dark chocolate. [employer] Eat  all sweets and desserts in moderation. Fats and Oils Nonhydrogenated (trans-free) margarines. Vegetable oils, including soybean, sesame, sunflower, olive, peanut, safflower, corn, canola, and cottonseed. Salad dressings or mayonnaise made with a vegetable oil. Limit added fats and oils that you use for cooking, baking, salads, and as spreads. Other Cocoa powder. Coffee and tea. All seasonings and condiments. The items listed above may not be a complete list of recommended foods or beverages. Contact your dietitian for more options. What foods are not recommended? Grains Breads that are made with saturated or trans fats, oils, or whole milk. Croissants. Butter rolls. Cheese breads. Sweet rolls. Donuts. Buttered popcorn. Chow mein noodles. High-fat crackers, such as cheese or butter crackers. Meats and Other Protein Sources Fatty meats, such as hotdogs, short ribs, sausage, spareribs, bacon, rib eye roast or steak, and mutton. High-fat deli meats, such as salami and bologna. Caviar. Domestic duck and goose. Organ meats, such as kidney, liver, sweetbreads, and heart. Dairy Cream, sour cream, cream cheese, and creamed cottage cheese. Whole-milk cheeses, including blue (bleu), [place] Jack, [place], [place], [place], [place], Swiss, cheddar, [place], and [place]. Whole or  2% milk that is liquid, evaporated, or condensed. Whole buttermilk. Cream sauce or high-fat cheese sauce. Yogurt that is made from whole milk. Beverages Regular sodas and juice drinks with added sugar. Sweets and Desserts Frosting. Pudding. Cookies. Cakes other than angel food cake. Candy that has milk chocolate or white chocolate, hydrogenated fat, butter, coconut, or unknown ingredients. Buttered syrups. Full-fat ice cream or ice cream drinks. Fats and Oils Gravy that has suet, meat fat, or shortening. Cocoa butter, hydrogenated oils, palm oil, coconut oil, palm kernel oil. These can often be found in baked products, candy, fried foods, nondairy creamers, and whipped toppings. Solid fats and shortenings, including bacon fat, salt pork, lard, and butter. Nondairy cream substitutes, such as coffee creamers and sour cream substitutes. Salad dressings that are made of unknown oils, cheese, or sour cream. The items listed above may not be a complete list of foods and beverages to avoid. Contact your dietitian for more information. This information is not intended to replace advice given to you by your health care provider. Make sure you discuss any questions you have with your health care provider. Document Released: 03/23/2012 Document Revised: 02/28/2016 Document Reviewed: 03/16/2014 Elsevier Interactive Patient Education  [employer].

## 2018-05-01 NOTE — Progress Notes (Signed)
Consult for PCP. [facility] added to AVS for patient to call to establish PCP.

## 2018-05-01 NOTE — Progress Notes (Signed)
Progress Note  Patient Name: Darius Patrick Date of Encounter: 05/01/2018  Primary Cardiologist: Larae Grooms, MD   Subjective   No chest pain or shortness of breath.  Feeling well.  Ready to return home.  He has had no further arrhythmias.  Inpatient Medications    Scheduled Meds: . aspirin  81 mg Oral Daily  . atorvastatin  80 mg Oral q1800  . citalopram  10 mg Oral Daily  . clopidogrel  75 mg Oral Q breakfast  . folic acid  1 mg Oral Daily  . magnesium oxide  200 mg Oral Daily  . metoprolol tartrate  12.5 mg Oral BID  . sodium chloride flush  3 mL Intravenous Q12H  . vitamin B-12  1,000 mcg Oral Daily   Continuous Infusions: . sodium chloride     PRN Meds: sodium chloride, acetaminophen, hydrALAZINE, nitroGLYCERIN, ondansetron (ZOFRAN) IV, sodium chloride flush   Vital Signs    Vitals:   04/30/18 1218 04/30/18 2144 05/01/18 0510 05/01/18 0810  BP: 129/87 109/82 123/73 107/78  Pulse: (!) 54 (!) 117 (!) 53 67  Resp:  19 17   Temp: 97.6 F (36.4 C) 98.3 F (36.8 C) 98.5 F (36.9 C)   TempSrc: Oral Oral Oral   SpO2: 100% 100% 98%   Weight:   151 lb 11.2 oz (68.8 kg)   Height:        Intake/Output Summary (Last 24 hours) at 05/01/2018 0818 Last data filed at 05/01/2018 4628 Gross per 24 hour  Intake 66.67 ml  Output 850 ml  Net -783.33 ml   Filed Weights   04/28/18 0858 04/30/18 0700 05/01/18 0510  Weight: 145 lb (65.8 kg) 149 lb 9.6 oz (67.9 kg) 151 lb 11.2 oz (68.8 kg)    Telemetry    Sinus rhythm- Personally Reviewed  ECG    None new- Personally Reviewed  Physical Exam   GEN: Well nourished, well developed, in no acute distress  HEENT: normal  Neck: no JVD, carotid bruits, or masses Cardiac: RRR; no murmurs, rubs, or gallops,no edema  Respiratory:  clear to auscultation bilaterally, normal work of breathing GI: soft, nontender, nondistended, + BS MS: no deformity or atrophy  Skin: warm and dry Neuro:  Strength and sensation are  intact Psych: euthymic mood, full affect    Labs    Chemistry Recent Labs  Lab 04/29/18 0202 04/30/18 0227 05/01/18 0354  NA 136 133* 134*  K 4.2 4.7 4.1  CL 104 103 102  CO2 21* 18* 24  GLUCOSE 84 81 88  BUN 9 13 11   CREATININE 1.18 1.04 1.06  CALCIUM 9.2 9.0 9.1  PROT 6.7  --   --   ALBUMIN 3.3*  --   --   AST 201*  --   --   ALT 37  --   --   ALKPHOS 52  --   --   BILITOT 0.7  --   --   GFRNONAA >60 >60 >60  GFRAA >60 >60 >60  ANIONGAP 11 12 8      Hematology Recent Labs  Lab 04/29/18 2359 04/30/18 0227 05/01/18 0354  WBC 6.9 6.4 5.6  RBC 4.17* 4.25 4.08*  HGB 11.6* 12.0* 11.4*  HCT 35.4* 36.6* 34.0*  MCV 84.9 86.1 83.3  MCH 27.8 28.2 27.9  MCHC 32.8 32.8 33.5  RDW 17.2* 17.7* 17.2*  PLT 196 205 200    Cardiac Enzymes Recent Labs  Lab 04/28/18 0855 04/28/18 1456 04/28/18 2128 04/29/18 0202  TROPONINI  1.05* 19.20* >65.00* >65.00*   No results for input(s): TROPIPOC in the last 168 hours.   BNPNo results for input(s): BNP, PROBNP in the last 168 hours.   DDimer No results for input(s): DDIMER in the last 168 hours.   Radiology    No results found.  Cardiac Studies   Cardiac cath 04/28/18:  Occluded RCA, not selectively visualized. Heavily calcified and could not be engaged. Left to right collaterals.  Mild to moderate heavily calcific CAD in the left system.  LV end diastolic pressure is normal.  There is no aortic valve stenosis.  Thoracic aorta appeared to show high takeoff of RCA. No dissection noted.  Severe tortuosity of the right subclavian artery with moderate atherosclerosis noted.  Diagnostic Diagram          Patient Profile     67 y.o. male with history of HTN, tobacco abuse admitted to [facility] 04/28/18 with an inferior STEMI. The RCA was occluded but could not be engaged and was seen to fill from left to right collaterals. The RCA was presumed to be occluded proximally. No PCI was done as he was chest pain free and the  vessel could not be engaged.   Assessment & Plan    1. CAD/Inferior STEMI: Had an acute RCA occlusion with a peak troponin of 65.  Unfortunately, unable to engage his RCA, though it was filling from left to right collaterals and thus no PCI performed.  Stable today.  No chest pain.  Continue dual antiplatelets for 1 year with aspirin and Plavix.  Continue statin and beta-blocker.  Echo with mild reduction in LVEF.  2. Tobacco abuse: Looking cessation encouraged  3. HTN: Blood pressure well controlled  4. NSVT: None further since stopping amiodarone drip.  No changes.  5.  2-1 AV block: Seen on EKG from 7/24.  Has since resolved.  Likely due to acute RCA occlusion.    For questions or updates, please contact [place] Please consult [email or web address] for contact info under Cardiology/STEMI.      Signed, Jaythan Hinely Meredith Leeds, MD  05/01/2018, 8:18 AM

## 2018-05-01 NOTE — Progress Notes (Addendum)
Rx was changed to [facility] in [place] per pt't request.  Call attempted to [facility] in [place] for cancellation.  Discharge instruction was given to pt.  Pt is awaiting for his ride.  Idolina Primer, RN

## 2018-05-03 ENCOUNTER — Telehealth: Payer: Self-pay | Admitting: Interventional Cardiology

## 2018-05-03 NOTE — Telephone Encounter (Signed)
New Message        Patient was not able to pick up all of the medication that was prescribed to him after he was discharged. He would like a call back concerning this matter.

## 2018-05-03 NOTE — Telephone Encounter (Signed)
Spoke with pt. Pt  states that he was D/C from the hospital last Friday. Pt  went to the [facility] pharmacy to have medications refill. The  pharmacist said that some medications would not be  able to refill until august. The cardiac medications are Metoprolol 25 mg pt takes 12.5 mg twice a day, Plavix 75 mg Atorvastatin 80 mg, NTG SL .4 mg  I   called [facility] . The person I spoke with states that pt's insurance rejected the medication refill. States that these medications can be filled till August 3rd.  Pt was made aware that he can [facility] , Metoprolol tartrate for 4 and 9 dollars a month. That he can get a few pills to last him until he can get his medications on Sunday. Pt states that he is in a fixed income and he cant afforded it. Pt states that with insurance he pays zero co pay. Pt and family member aware.

## 2018-05-04 NOTE — Telephone Encounter (Signed)
Patient with recent STEMI and needs his meds. Called and spoke to [facility] in [place], [place]. She states that the patient's meds cannot be filled until October 16th. She states that [facility] filled those Rx's on 7/27. Called and spoke to [facility] in [place], [place] and she states that she has Rxs for atorvastatin, NTG, ASA, Mg, metoprolol, and plavix are there ready waiting for the patient to pick up. Made her aware that the patient does not live in [place] and that the patient would like them put back so that the insurance will let them fill the Rxs at [facility] in [facility]. RPH at [facility] states that she will put them back.

## 2018-05-04 NOTE — Telephone Encounter (Signed)
Called and spoke to [name] at [facility] in [place] who states that the patient's Rxs for atorvastatin, NTG, ASA, Mg, metoprolol, and plavix are ready for the patient to pick up.   Called and made Darius Patrick aware. She states that they are going to pick up the meds and get them started.

## 2018-05-06 NOTE — Telephone Encounter (Signed)
In reviewing the patient's chart, patient was scheduled for post hospital f/u for his STEMI on October 25th. This is too far out. Appointment re-scheduled for 8/7 at 3:30 PM with Lyda Jester, PA. Called and spoke with Mrs. Rackley who agrees with appointment change.

## 2018-05-12 ENCOUNTER — Ambulatory Visit: Payer: Medicare Other | Admitting: Cardiology

## 2018-06-22 ENCOUNTER — Ambulatory Visit (INDEPENDENT_AMBULATORY_CARE_PROVIDER_SITE_OTHER): Payer: Medicare Other | Admitting: Cardiology

## 2018-06-22 ENCOUNTER — Encounter (INDEPENDENT_AMBULATORY_CARE_PROVIDER_SITE_OTHER): Payer: Self-pay

## 2018-06-22 ENCOUNTER — Telehealth: Payer: Self-pay | Admitting: Cardiology

## 2018-06-22 ENCOUNTER — Encounter: Payer: Self-pay | Admitting: Cardiology

## 2018-06-22 VITALS — BP 124/86 | HR 56 | Ht 68.0 in | Wt 151.4 lb

## 2018-06-22 DIAGNOSIS — Z79899 Other long term (current) drug therapy: Secondary | ICD-10-CM | POA: Diagnosis not present

## 2018-06-22 DIAGNOSIS — E785 Hyperlipidemia, unspecified: Secondary | ICD-10-CM | POA: Diagnosis not present

## 2018-06-22 DIAGNOSIS — I2510 Atherosclerotic heart disease of native coronary artery without angina pectoris: Secondary | ICD-10-CM

## 2018-06-22 DIAGNOSIS — I10 Essential (primary) hypertension: Secondary | ICD-10-CM | POA: Diagnosis not present

## 2018-06-22 LAB — CBC
Hematocrit: 40.3 % (ref 37.5–51.0)
Hemoglobin: 13.4 g/dL (ref 13.0–17.7)
MCH: 27.5 pg (ref 26.6–33.0)
MCHC: 33.3 g/dL (ref 31.5–35.7)
MCV: 83 fL (ref 79–97)
PLATELETS: 284 10*3/uL (ref 150–450)
RBC: 4.87 x10E6/uL (ref 4.14–5.80)
RDW: 17.4 % — ABNORMAL HIGH (ref 12.3–15.4)
WBC: 6 10*3/uL (ref 3.4–10.8)

## 2018-06-22 LAB — HEPATIC FUNCTION PANEL
ALK PHOS: 80 IU/L (ref 39–117)
ALT: 9 IU/L (ref 0–44)
AST: 16 IU/L (ref 0–40)
Albumin: 4 g/dL (ref 3.6–4.8)
Bilirubin Total: 0.3 mg/dL (ref 0.0–1.2)
Bilirubin, Direct: 0.11 mg/dL (ref 0.00–0.40)
Total Protein: 7.3 g/dL (ref 6.0–8.5)

## 2018-06-22 LAB — LIPID PANEL
CHOLESTEROL TOTAL: 86 mg/dL — AB (ref 100–199)
Chol/HDL Ratio: 2.1 ratio (ref 0.0–5.0)
HDL: 41 mg/dL (ref >39)
LDL CALC: 34 mg/dL (ref 0–99)
Triglycerides: 54 mg/dL (ref 0–149)
VLDL CHOLESTEROL CAL: 11 mg/dL (ref 5–40)

## 2018-06-22 NOTE — Telephone Encounter (Signed)
Reviewed lab results with patient who verbalized understanding. He thanked me for the call.

## 2018-06-22 NOTE — Progress Notes (Signed)
06/22/2018 Darius Patrick   02/24/1951  [id number]  Primary Physician Patient, No Pcp Per Primary Cardiologist: Dr. Irish Patrick   Reason for Visit/CC: [facility] f/u for CAD s/p Inferior STEMI.   HPI:  Darius Patrick is a 67 y.o. male w/o CAD, HTN, HLD and h/o tobacco use, who is being seen today for the evaluation of post hospital f/u, after recent admission for STEMI.   To summarize his hospital course, [name] chest pain about 8 am 04/28/18. It started with moderate exertion. It was 10/10 and associated with SOB and N&V. Brother took him to [facility] and they transferred him to [facility] for an inferior STEMI. He was given ASA and heparin, transferred urgently to [facility] and taken directly to the cath lab.  He was taken to the cath lab for PCI and found to have acute RCA occlusion with peak troponin of 65. Unable to engage RCA, collaterals left-to-right. No PCI. There was also mild to moderate heavily calcific CAD in the left system, managed medically. He was treated with IV heparin, DAPT w/ ASA and Plavix, BB and high dose statin therapy. Chest pain resolved. Pt tolerated the procedure well. 2D echo showed normal LVEF at 50-55% with hypokinesis of the inferolateral and inferior myocardium + G2DD. Lipid panel showed LDL at 85 mg/dl.m Also of note, he did have some nonsustained VT as well as 2-1 AV block in the original post STEMI course, which ultimately resolved. On 05/01/18, he was seen and examined by Dr. Curt Patrick and felt stable for discharge home. Recommendations were to continue ASA and plavix x 12 months.   He now presents for post hospital f/u. He was originally scheduled for 05/12/18, but canceled that appt. He reports he is doing well today. He denies any recurrent CP. No dyspnea. He has not required SL NTG use. He has been active w/ yard work w/o exertional symptoms. Also denies palpitations, syncope/ near syncope. He reports full med compliance. No side effects or intolerances. He denies abnormal  bleeding w/ DAPT. He is fasting today. He has worked to try to cut down on smoking. He is down to 5 cigarettes daily. He is trying to quit w/o smoking cessation aids. We discussed assistance, but he is not interested. Cath site is ok.   Cardiac Studies  Left heart cath 04/28/18:  Occluded RCA, not selectively visualized. Heavily calcified and could not be engaged. Left to right collaterals.  Mild to moderate heavily calcific CAD in the left system.  LV end diastolic pressure is normal.  There is no aortic valve stenosis.  Thoracic aorta appeared to show high takeoff of RCA. No dissection noted.  Severe tortuosity of the right subclavian artery with moderate atherosclerosis noted.  Recommend uninterrupted dual antiplatelet therapy with Aspirin 81mg  daily and Clopidogrel 75mg  daily for a minimum of 12 months (ACS - Class I recommendation).  Coronary Diagrams   Diagnostic Diagram         Echo 04/30/18: Study Conclusions: - Left ventricle: The cavity size was normal. Wall thickness was increased in a pattern of mild LVH. Systolic function was normal. The estimated ejection fraction was in the range of 50% to 55%. There is hypokinesis of the inferolateral and inferior myocardium. Features are consistent with a pseudonormal left ventricular filling pattern, with concomitant abnormal relaxation and increased filling pressure (grade 2 diastolic dysfunction). - Mitral valve: Calcified annulus. There was mild regurgitation. - Left atrium: The atrium was mildly dilated. - Pericardium, extracardiac: A trivial pericardial effusion was identified.  Impressions: - Hypokinesis of the basal inferior and inferolateral walls with overall low normal LV systolic function; mild LVH; moderate diastolic dysfunction; mild LAE; mild MR and TR.  Current Meds  Medication Sig  . aspirin EC 81 MG tablet Take 1 tablet (81 mg total) by mouth daily.  Marland Kitchen atorvastatin (LIPITOR) 80  MG tablet Take 1 tablet (80 mg total) by mouth daily at 6 PM.  . citalopram (CELEXA) 10 MG tablet Take 10 mg by mouth daily.  . clopidogrel (PLAVIX) 75 MG tablet Take 1 tablet (75 mg total) by mouth daily with breakfast.  . folic acid (FOLVITE) 1 MG tablet Take 1 mg by mouth daily.  . magnesium oxide (MAG-OX) 400 (241.3 Mg) MG tablet Take 0.5 tablets (200 mg total) by mouth daily.  . metoprolol tartrate (LOPRESSOR) 25 MG tablet Take 0.5 tablets (12.5 mg total) by mouth 2 (two) times daily.  . nitroGLYCERIN (NITROSTAT) 0.4 MG SL tablet Place 1 tablet (0.4 mg total) under the tongue every 5 (five) minutes x 3 doses as needed for chest pain.  . traZODone (DESYREL) 100 MG tablet Take 100-200 mg by mouth at bedtime.  . vitamin B-12 (CYANOCOBALAMIN) 1000 MCG tablet Take 1,000 mcg by mouth daily.   No Known Allergies Past Medical History:  Diagnosis Date  . Acid reflux   . Anxiety   . Hypertension    Family History  Problem Relation Age of Onset  . Hypertension Mother 68  . Heart attack Father 55  . Hypertension Sister   . Heart disease Brother    Past Surgical History:  Procedure Laterality Date  . LEFT HEART CATH AND CORONARY ANGIOGRAPHY N/A 04/28/2018   Procedure: LEFT HEART CATH AND CORONARY ANGIOGRAPHY;  Surgeon: Darius Booze, MD;  Location: [facility];  Service: Cardiovascular;  Laterality: N/A;   Social History   Socioeconomic History  . Marital status: Divorced    Spouse name: Not on file  . Number of children: Not on file  . Years of education: Not on file  . Highest education level: Not on file  Occupational History  . Occupation: Retired Forensic scientist  . Financial resource strain: Not on file  . Food insecurity:    Worry: Not on file    Inability: Not on file  . Transportation needs:    Medical: Not on file    Non-medical: Not on file  Tobacco Use  . Smoking status: Current Every Day Smoker    Packs/day: 0.50  . Smokeless  tobacco: Never Used  Substance and Sexual Activity  . Alcohol use: Yes    Alcohol/week: 6.0 standard drinks    Types: 3 Cans of beer, 3 Glasses of wine per week    Comment: a few beers and a pint of wine a week.  . Drug use: Not Currently    Comment: not since the 70s  . Sexual activity: Not on file  Lifestyle  . Physical activity:    Days per week: Not on file    Minutes per session: Not on file  . Stress: Not on file  Relationships  . Social connections:    Talks on phone: Not on file    Gets together: Not on file    Attends religious service: Not on file    Active member of club or organization: Not on file    Attends meetings of clubs or organizations: Not on file    Relationship status: Not on file  .  Intimate partner violence:    Fear of current or ex partner: Not on file    Emotionally abused: Not on file    Physically abused: Not on file    Forced sexual activity: Not on file  Other Topics Concern  . Not on file  Social History Narrative   Pt lives alone in [place], [place].      Review of Systems: General: negative for chills, fever, night sweats or weight changes.  Cardiovascular: negative for chest pain, dyspnea on exertion, edema, orthopnea, palpitations, paroxysmal nocturnal dyspnea or shortness of breath Dermatological: negative for rash Respiratory: negative for cough or wheezing Urologic: negative for hematuria Abdominal: negative for nausea, vomiting, diarrhea, bright red blood per rectum, melena, or hematemesis Neurologic: negative for visual changes, syncope, or dizziness All other systems reviewed and are otherwise negative except as noted above.   Physical Exam:  Blood pressure 124/86, pulse (!) 56, height 5\' 8"  (1.727 m), weight 151 lb 6.4 oz (68.7 kg), SpO2 99 %.  General appearance: alert, cooperative and no distress Neck: no carotid bruit and no JVD Lungs: clear to auscultation bilaterally Heart: regular rate and rhythm, S1, S2 normal, no murmur,  click, rub or gallop Extremities: extremities normal, atraumatic, no cyanosis or edema Pulses: 2+ and symmetric Skin: Skin color, texture, turgor normal. No rashes or lesions Neurologic: Grossly normal  EKG not performed  -- personally reviewed   ASSESSMENT AND PLAN:   1. CAD: s/p inferior STEMI 04/2018 with cath showing acute RCA occlusion with peak troponin of 65. Unable to engage RCA, collaterals left-to-right. No PCI. There was also mild to moderate heavily calcific CAD in the left system, managed medically. EF normal. He is doing well with medical therapy. No recurrent anginal symptoms. No abnormal bleeding w/ DAPT. Will check CBC today to check H/H. Plan to continue ASA + Plavix for at least 12 months, along with BB and high dose statin therapy. Smoking cessation encouraged.   2. HTN: controlled on current regimen. Continue metoprolol.   3. HLD: recent lipid panel 04/2018 showed elevated LDL at 85 mg/dL. Goal LDL in the setting of CAD is 70mg /dL. He has been on high dose statin therapy w/ Lipitor 80 mg x 6 weeks. He is fasting today. We will check FLP and HFTs today.   4. Tobacco Use:  Smoking cessation encouraged. He has worked to try to cut down on smoking. He is down to 5 cigarettes daily. He is trying to quit w/o smoking cessation aids. We discussed assistance, but he is not interested.    5. Diastolic Dysfunction: H4TM noted on recent echo with normal LVF. Euvolemic on exam. No dyspnea. He is on oral BB with controlled HR and BP.   Follow-Up w/ Dr. Irish Patrick in 3 months.   Darius Patrick, MHS [facility] 06/22/2018 8:21 AM

## 2018-06-22 NOTE — Patient Instructions (Addendum)
Medication Instructions:  Your physician recommends that you continue on your current medications as directed. Please refer to the Current Medication list given to you today.   Labwork: TODAY:  LIPID, LFT, & CBC  Testing/Procedures: None ordered  Follow-Up: Your physician recommends that you schedule a follow-up appointment in: 3 MONTHS WITH DR. VARANASI   Any Other Special Instructions Will Be Listed Below (If Applicable).   Steps to Quit Smoking Smoking tobacco can be bad for your health. It can also affect almost every organ in your body. Smoking puts you and people around you at risk for many serious long-lasting (chronic) diseases. Quitting smoking is hard, but it is one of the best things that you can do for your health. It is never too late to quit. What are the benefits of quitting smoking? When you quit smoking, you lower your risk for getting serious diseases and conditions. They can include:  Lung cancer or lung disease.  Heart disease.  Stroke.  Heart attack.  Not being able to have children (infertility).  Weak bones (osteoporosis) and broken bones (fractures).  If you have coughing, wheezing, and shortness of breath, those symptoms may get better when you quit. You may also get sick less often. If you are pregnant, quitting smoking can help to lower your chances of having a baby of low birth weight. What can I do to help me quit smoking? Talk with your doctor about what can help you quit smoking. Some things you can do (strategies) include:  Quitting smoking totally, instead of slowly cutting back how much you smoke over a period of time.  Going to in-person counseling. You are more likely to quit if you go to many counseling sessions.  Using resources and support systems, such as: ? Database administrator with a Social worker. ? Phone quitlines. ? Careers information officer. ? Support groups or group counseling. ? Text messaging programs. ? Mobile phone apps or  applications.  Taking medicines. Some of these medicines may have nicotine in them. If you are pregnant or breastfeeding, do not take any medicines to quit smoking unless your doctor says it is okay. Talk with your doctor about counseling or other things that can help you.  Talk with your doctor about using more than one strategy at the same time, such as taking medicines while you are also going to in-person counseling. This can help make quitting easier. What things can I do to make it easier to quit? Quitting smoking might feel very hard at first, but there is a lot that you can do to make it easier. Take these steps:  Talk to your family and friends. Ask them to support and encourage you.  Call phone quitlines, reach out to support groups, or work with a Social worker.  Ask people who smoke to not smoke around you.  Avoid places that make you want (trigger) to smoke, such as: ? Bars. ? Parties. ? Smoke-break areas at work.  Spend time with people who do not smoke.  Lower the stress in your life. Stress can make you want to smoke. Try these things to help your stress: ? Getting regular exercise. ? Deep-breathing exercises. ? Yoga. ? Meditating. ? Doing a body scan. To do this, close your eyes, focus on one area of your body at a time from head to toe, and notice which parts of your body are tense. Try to relax the muscles in those areas.  Download or buy apps on your mobile phone or tablet  that can help you stick to your quit plan. There are many free apps, such as QuitGuide from the [employer] Office manager for Disease Control and Prevention). You can find more support from smokefree.gov and other websites.  This information is not intended to replace advice given to you by your health care provider. Make sure you discuss any questions you have with your health care provider. Document Released: 07/19/2009 Document Revised: 05/20/2016 Document Reviewed: 02/06/2015 Elsevier Interactive Patient  Education  [employer].    If you need a refill on your cardiac medications before your next appointment, please call your pharmacy.

## 2018-06-22 NOTE — Telephone Encounter (Signed)
New Message:   Patient returning call back to [facility]

## 2018-07-30 ENCOUNTER — Ambulatory Visit: Payer: Medicare Other | Admitting: Interventional Cardiology

## 2018-09-22 NOTE — Progress Notes (Signed)
Cardiology Office Note   Date:  09/23/2018   ID:  Darius Patrick, DOB 06-04-1951, MRN [id number]  PCP:  Patient, No Pcp Per    No chief complaint on file.  CAD  Wt Readings from Last 3 Encounters:  09/23/18 159 lb 3.2 oz (72.2 kg)  06/22/18 151 lb 6.4 oz (68.7 kg)  05/01/18 151 lb 11.2 oz (68.8 kg)       History of Present Illness: Darius Patrick is a 67 y.o. male  Who had an inferior MI on 7/19.  RCA could not be engaged and his CP resolved in the cath lab.  No PCI was performed.   Cath in 7/19 showed:  Occluded RCA, not selectively visualized. Heavily calcified and could not be engaged. Left to right collaterals.  Mild to moderate heavily calcific CAD in the left system.  LV end diastolic pressure is normal.  There is no aortic valve stenosis.  Thoracic aorta appeared to show high takeoff of RCA. No dissection noted.  Severe tortuosity of the right subclavian artery with moderate atherosclerosis noted.    Recommend uninterrupted dual antiplatelet therapy with Aspirin 81mg  daily and Clopidogrel 75mg  daily for a minimum of 12 months (ACS - Class I recommendation).   Start beta blocker, statin along with aggressive secondary prevention.    Watch in ICU.  Heparin for 24-48 hours after sheath pull.  He was treated with IV heparin, DAPT w/ ASA and Plavix, BB and high dose statin therapy. Chest pain resolved. Pt tolerated the procedure well. 2D echo showed normal LVEF at 50-55% with hypokinesis of the inferolateral and inferior myocardium + G2DD.Lipid panel showed LDL at 85 mg/dl.m Also of note, he did have some nonsustained VT as well as 2-1 AV block in the original post STEMI course, which ultimately resolved.  7/19 echo: Left ventricle: The cavity size was normal. Wall thickness was   increased in a pattern of mild LVH. Systolic function was normal.   The estimated ejection fraction was in the range of 50% to 55%.   There is hypokinesis of the inferolateral and  inferior   myocardium. Features are consistent with a pseudonormal left   ventricular filling pattern, with concomitant abnormal relaxation   and increased filling pressure (grade 2 diastolic dysfunction). - Mitral valve: Calcified annulus. There was mild regurgitation. - Left atrium: The atrium was mildly dilated. - Pericardium, extracardiac: A trivial pericardial effusion was   identified.  Impressions:  - Hypokinesis of the basal inferior and inferolateral walls with   overall low normal LV systolic function; mild LVH; moderate   diastolic dysfunction; mild LAE; mild MR and TR.  Smoking cessation was encouraged.  He has cut back.  He decreased alcohol intake a lot of MI and is eating better.  He has gained weight.    Since the last visit: Denies : Exertional Chest pain. Dizziness. Leg edema. Nitroglycerin use. Orthopnea. Palpitations. Paroxysmal nocturnal dyspnea. Shortness of breath. Syncope.   He walks several times a day, 0.25 miles at a time wihtout any problems.  Has ad one episode of sharp chest pain that went away on its own.   Past Medical History:  Diagnosis Date  . Acid reflux   . Anxiety   . Hypertension     Past Surgical History:  Procedure Laterality Date  . LEFT HEART CATH AND CORONARY ANGIOGRAPHY N/A 04/28/2018   Procedure: LEFT HEART CATH AND CORONARY ANGIOGRAPHY;  Surgeon: Jettie Booze, MD;  Location: [facility];  Service: Cardiovascular;  Laterality: N/A;     Current Outpatient Medications  Medication Sig Dispense Refill  . aspirin EC 81 MG tablet Take 1 tablet (81 mg total) by mouth daily. 90 tablet 3  . atorvastatin (LIPITOR) 80 MG tablet Take 1 tablet (80 mg total) by mouth daily at 6 PM. 90 tablet 3  . citalopram (CELEXA) 10 MG tablet Take 10 mg by mouth daily.    . clopidogrel (PLAVIX) 75 MG tablet Take 1 tablet (75 mg total) by mouth daily with breakfast. 90 tablet 3  . folic acid (FOLVITE) 1 MG tablet Take 1 mg by mouth daily.     . magnesium oxide (MAG-OX) 400 (241.3 Mg) MG tablet Take 0.5 tablets (200 mg total) by mouth daily. 30 tablet 1  . metoprolol tartrate (LOPRESSOR) 25 MG tablet Take 0.5 tablets (12.5 mg total) by mouth 2 (two) times daily. 180 tablet 3  . nitroGLYCERIN (NITROSTAT) 0.4 MG SL tablet Place 1 tablet (0.4 mg total) under the tongue every 5 (five) minutes x 3 doses as needed for chest pain. 25 tablet 3  . traZODone (DESYREL) 100 MG tablet Take 100-200 mg by mouth at bedtime.    . vitamin B-12 (CYANOCOBALAMIN) 1000 MCG tablet Take 1,000 mcg by mouth daily.     No current facility-administered medications for this visit.     Allergies:   Patient has no known allergies.    Social History:  The patient  reports that he has been smoking. He has been smoking about 0.50 packs per day. He has never used smokeless tobacco. He reports current alcohol use of about 6.0 standard drinks of alcohol per week. He reports previous drug use.   Family History:  The patient's family history includes Heart attack (age of onset: 86) in his father; Heart disease in his brother; Hypertension in his sister; Hypertension (age of onset: 30) in his mother.    ROS:  Please see the history of present illness.   Otherwise, review of systems are positive for eating better.   All other systems are reviewed and negative.    PHYSICAL EXAM: VS:  BP 122/80   Pulse 71   Ht 5\' 8"  (1.727 m)   Wt 159 lb 3.2 oz (72.2 kg)   SpO2 91%   BMI 24.21 kg/m  , BMI Body mass index is 24.21 kg/m. GEN: Well nourished, well developed, in no acute distress  HEENT: normal  Neck: no JVD, carotid bruits, or masses Cardiac: RRR; no murmurs, rubs, or gallops,no edema  Respiratory:  clear to auscultation bilaterally, normal work of breathing GI: soft, nontender, nondistended, + BS MS: no deformity or atrophy  Skin: warm and dry, no rash Neuro:  Strength and sensation are intact Psych: euthymic mood, full affect   Recent Labs: 05/01/2018:  BUN 11; Creatinine, Ser 1.06; Magnesium 1.4; Potassium 4.1; Sodium 134 06/22/2018: ALT 9; Hemoglobin 13.4; Platelets 284   Lipid Panel    Component Value Date/Time   CHOL 86 (L) 06/22/2018 0826   TRIG 54 06/22/2018 0826   HDL 41 06/22/2018 0826   CHOLHDL 2.1 06/22/2018 0826   CHOLHDL 2.9 04/29/2018 0202   VLDL 15 04/29/2018 0202   LDLCALC 34 06/22/2018 0826     Other studies Reviewed: Additional studies/ records that were reviewed today with results demonstrating: .   ASSESSMENT AND PLAN:  1. CAD/MI: No angina.  Continue aggressive secondary prevention. 2. Hyperlipidemia: Lipids well controlled in September 2019. 3. HTN: The current medical regimen is effective;  continue present plan and medications. 4. Smoking: He has cut back.  He would benefit from stopping.  He has reduced alcohol intake as well significantly.   Current medicines are reviewed at length with the patient today.  The patient concerns regarding his medicines were addressed.  The following changes have been made:  No change  Labs/ tests ordered today include:  No orders of the defined types were placed in this encounter.   Recommend 150 minutes/week of aerobic exercise Low fat, low carb, high fiber diet recommended  Disposition:   FU in 6 months   Signed, Larae Grooms, MD  09/23/2018 8:14 AM    [facility] [address], [place], [place]  [address] Phone: [phone]; Fax: [phone]

## 2018-09-23 ENCOUNTER — Ambulatory Visit (INDEPENDENT_AMBULATORY_CARE_PROVIDER_SITE_OTHER): Payer: Medicare Other | Admitting: Interventional Cardiology

## 2018-09-23 ENCOUNTER — Encounter: Payer: Self-pay | Admitting: Interventional Cardiology

## 2018-09-23 VITALS — BP 122/80 | HR 71 | Ht 68.0 in | Wt 159.2 lb

## 2018-09-23 DIAGNOSIS — I10 Essential (primary) hypertension: Secondary | ICD-10-CM | POA: Diagnosis not present

## 2018-09-23 DIAGNOSIS — Z720 Tobacco use: Secondary | ICD-10-CM

## 2018-09-23 DIAGNOSIS — I25118 Atherosclerotic heart disease of native coronary artery with other forms of angina pectoris: Secondary | ICD-10-CM

## 2018-09-23 DIAGNOSIS — E785 Hyperlipidemia, unspecified: Secondary | ICD-10-CM

## 2018-09-23 DIAGNOSIS — I2510 Atherosclerotic heart disease of native coronary artery without angina pectoris: Secondary | ICD-10-CM

## 2018-09-23 NOTE — Patient Instructions (Signed)
Medication Instructions:  Your physician recommends that you continue on your current medications as directed. Please refer to the Current Medication list given to you today.  If you need a refill on your cardiac medications before your next appointment, please call your pharmacy.   Lab work: None Ordered  If you have labs (blood work) drawn today and your tests are completely normal, you will receive your results only by: Marland Kitchen MyChart Message (if you have MyChart) OR . A paper copy in the mail If you have any lab test that is abnormal or we need to change your treatment, we will call you to review the results.  Testing/Procedures: None ordered  Follow-Up: At [facility], you and your health needs are our priority.  As part of our continuing mission to provide you with exceptional heart care, we have created designated Provider Care Teams.  These Care Teams include your primary Cardiologist (physician) and Advanced Practice Providers (APPs -  Physician Assistants and Nurse Practitioners) who all work together to provide you with the care you need, when you need it. . You will need a follow up appointment in 6 months.  Please call our office 2 months in advance to schedule this appointment.  You may see Casandra Doffing, MD or one of the following Advanced Practice Providers on your designated Care Team:   . Lyda Jester, PA-C . Dayna Dunn, PA-C . Ermalinda Barrios, PA-C  Any Other Special Instructions Will Be Listed Below (If Applicable).   Heart-Healthy Eating Plan Heart-healthy meal planning includes:  Eating less unhealthy fats.  Eating more healthy fats.  Making other changes in your diet. Talk with your doctor or a diet specialist (dietitian) to create an eating plan that is right for you. What is my plan? Your doctor may recommend an eating plan that includes:  Total fat: ______% or less of total calories a day.  Saturated fat: ______% or less of total calories a  day.  Cholesterol: less than _________mg a day. What are tips for following this plan? Cooking Avoid frying your food. Try to bake, boil, grill, or broil it instead. You can also reduce fat by:  Removing the skin from poultry.  Removing all visible fats from meats.  Steaming vegetables in water or broth. Meal planning   At meals, divide your plate into four equal parts: ? Fill one-half of your plate with vegetables and green salads. ? Fill one-fourth of your plate with whole grains. ? Fill one-fourth of your plate with lean protein foods.  Eat 4-5 servings of vegetables per day. A serving of vegetables is: ? 1 cup of raw or cooked vegetables. ? 2 cups of raw leafy greens.  Eat 4-5 servings of fruit per day. A serving of fruit is: ? 1 medium whole fruit. ?  cup of dried fruit. ?  cup of fresh, frozen, or canned fruit. ?  cup of 100% fruit juice.  Eat more foods that have soluble fiber. These are apples, broccoli, carrots, beans, peas, and barley. Try to get 20-30 g of fiber per day.  Eat 4-5 servings of nuts, legumes, and seeds per week: ? 1 serving of dried beans or legumes equals  cup after being cooked. ? 1 serving of nuts is  cup. ? 1 serving of seeds equals 1 tablespoon. General information  Eat more home-cooked food. Eat less restaurant, buffet, and fast food.  Limit or avoid alcohol.  Limit foods that are high in starch and sugar.  Avoid fried  foods.  Lose weight if you are overweight.  Keep track of how much salt (sodium) you eat. This is important if you have high blood pressure. Ask your doctor to tell you more about this.  Try to add vegetarian meals each week. Fats  Choose healthy fats. These include olive oil and canola oil, flaxseeds, walnuts, almonds, and seeds.  Eat more omega-3 fats. These include salmon, mackerel, sardines, tuna, flaxseed oil, and ground flaxseeds. Try to eat fish at least 2 times each week.  Check food labels. Avoid  foods with trans fats or high amounts of saturated fat.  Limit saturated fats. ? These are often found in animal products, such as meats, butter, and cream. ? These are also found in plant foods, such as palm oil, palm kernel oil, and coconut oil.  Avoid foods with partially hydrogenated oils in them. These have trans fats. Examples are stick margarine, some tub margarines, cookies, crackers, and other baked goods. What foods can I eat? Fruits All fresh, canned (in natural juice), or frozen fruits. Vegetables Fresh or frozen vegetables (raw, steamed, roasted, or grilled). Green salads. Grains Most grains. Choose whole wheat and whole grains most of the time. Rice and pasta, including brown rice and pastas made with whole wheat. Meats and other proteins Lean, well-trimmed beef, veal, pork, and lamb. Chicken and [place] without skin. All fish and shellfish. Wild duck, rabbit, pheasant, and venison. Egg whites or low-cholesterol egg substitutes. Dried beans, peas, lentils, and tofu. Seeds and most nuts. Dairy Low-fat or nonfat cheeses, including ricotta and mozzarella. Skim or 1% milk that is liquid, powdered, or evaporated. Buttermilk that is made with low-fat milk. Nonfat or low-fat yogurt. Fats and oils Non-hydrogenated (trans-free) margarines. Vegetable oils, including soybean, sesame, sunflower, olive, peanut, safflower, corn, canola, and cottonseed. Salad dressings or mayonnaise made with a vegetable oil. Beverages Mineral water. Coffee and tea. Diet carbonated beverages. Sweets and desserts Sherbet, gelatin, and fruit ice. Small amounts of dark chocolate. Limit all sweets and desserts. Seasonings and condiments All seasonings and condiments. The items listed above may not be a complete list of foods and drinks you can eat. Contact a dietitian for more options. What foods should I avoid? Fruits Canned fruit in heavy syrup. Fruit in cream or butter sauce. Fried fruit. Limit  coconut. Vegetables Vegetables cooked in cheese, cream, or butter sauce. Fried vegetables. Grains Breads that are made with saturated or trans fats, oils, or whole milk. Croissants. Sweet rolls. Donuts. High-fat crackers, such as cheese crackers. Meats and other proteins Fatty meats, such as hot dogs, ribs, sausage, bacon, rib-eye roast or steak. High-fat deli meats, such as salami and bologna. Caviar. Domestic duck and goose. Organ meats, such as liver. Dairy Cream, sour cream, cream cheese, and creamed cottage cheese. Whole-milk cheeses. Whole or 2% milk that is liquid, evaporated, or condensed. Whole buttermilk. Cream sauce or high-fat cheese sauce. Yogurt that is made from whole milk. Fats and oils Meat fat, or shortening. Cocoa butter, hydrogenated oils, palm oil, coconut oil, palm kernel oil. Solid fats and shortenings, including bacon fat, salt pork, lard, and butter. Nondairy cream substitutes. Salad dressings with cheese or sour cream. Beverages Regular sodas and juice drinks with added sugar. Sweets and desserts Frosting. Pudding. Cookies. Cakes. Pies. Milk chocolate or white chocolate. Buttered syrups. Full-fat ice cream or ice cream drinks. The items listed above may not be a complete list of foods and drinks to avoid. Contact a dietitian for more information. Summary  Heart-healthy meal  planning includes eating less unhealthy fats, eating more healthy fats, and making other changes in your diet.  Eat a balanced diet. This includes fruits and vegetables, low-fat or nonfat dairy, lean protein, nuts and legumes, whole grains, and heart-healthy oils and fats. This information is not intended to replace advice given to you by your health care provider. Make sure you discuss any questions you have with your health care provider. Document Released: 03/23/2012 Document Revised: 10/30/2017 Document Reviewed: 10/30/2017 Elsevier Interactive Patient Education  2019 [employer].

## 2018-10-17 DIAGNOSIS — Z79899 Other long term (current) drug therapy: Secondary | ICD-10-CM | POA: Diagnosis not present

## 2018-10-17 DIAGNOSIS — Z6823 Body mass index (BMI) 23.0-23.9, adult: Secondary | ICD-10-CM | POA: Diagnosis not present

## 2018-10-17 DIAGNOSIS — R079 Chest pain, unspecified: Secondary | ICD-10-CM | POA: Diagnosis not present

## 2018-10-17 DIAGNOSIS — I5020 Unspecified systolic (congestive) heart failure: Secondary | ICD-10-CM | POA: Diagnosis not present

## 2018-10-17 DIAGNOSIS — Z7902 Long term (current) use of antithrombotics/antiplatelets: Secondary | ICD-10-CM | POA: Diagnosis not present

## 2018-10-17 DIAGNOSIS — I2582 Chronic total occlusion of coronary artery: Secondary | ICD-10-CM | POA: Diagnosis not present

## 2018-10-17 DIAGNOSIS — R7989 Other specified abnormal findings of blood chemistry: Secondary | ICD-10-CM | POA: Diagnosis not present

## 2018-10-17 DIAGNOSIS — Z9114 Patient's other noncompliance with medication regimen: Secondary | ICD-10-CM | POA: Diagnosis not present

## 2018-10-17 DIAGNOSIS — T508X5A Adverse effect of diagnostic agents, initial encounter: Secondary | ICD-10-CM | POA: Diagnosis not present

## 2018-10-17 DIAGNOSIS — R001 Bradycardia, unspecified: Secondary | ICD-10-CM | POA: Diagnosis not present

## 2018-10-17 DIAGNOSIS — N179 Acute kidney failure, unspecified: Secondary | ICD-10-CM | POA: Diagnosis not present

## 2018-10-17 DIAGNOSIS — I214 Non-ST elevation (NSTEMI) myocardial infarction: Secondary | ICD-10-CM | POA: Diagnosis not present

## 2018-10-17 DIAGNOSIS — I252 Old myocardial infarction: Secondary | ICD-10-CM | POA: Diagnosis not present

## 2018-10-17 DIAGNOSIS — Z6824 Body mass index (BMI) 24.0-24.9, adult: Secondary | ICD-10-CM | POA: Diagnosis not present

## 2018-10-17 DIAGNOSIS — E162 Hypoglycemia, unspecified: Secondary | ICD-10-CM | POA: Diagnosis not present

## 2018-10-17 DIAGNOSIS — R9431 Abnormal electrocardiogram [ECG] [EKG]: Secondary | ICD-10-CM | POA: Diagnosis not present

## 2018-10-17 DIAGNOSIS — I2510 Atherosclerotic heart disease of native coronary artery without angina pectoris: Secondary | ICD-10-CM | POA: Diagnosis not present

## 2018-10-17 DIAGNOSIS — R072 Precordial pain: Secondary | ICD-10-CM | POA: Diagnosis not present

## 2018-10-17 DIAGNOSIS — R0789 Other chest pain: Secondary | ICD-10-CM | POA: Diagnosis not present

## 2018-10-17 DIAGNOSIS — Z7982 Long term (current) use of aspirin: Secondary | ICD-10-CM | POA: Diagnosis not present

## 2018-10-17 DIAGNOSIS — R202 Paresthesia of skin: Secondary | ICD-10-CM | POA: Diagnosis not present

## 2018-10-17 DIAGNOSIS — E161 Other hypoglycemia: Secondary | ICD-10-CM | POA: Diagnosis not present

## 2018-10-17 DIAGNOSIS — E785 Hyperlipidemia, unspecified: Secondary | ICD-10-CM | POA: Diagnosis not present

## 2018-10-17 DIAGNOSIS — I2119 ST elevation (STEMI) myocardial infarction involving other coronary artery of inferior wall: Secondary | ICD-10-CM | POA: Diagnosis not present

## 2018-10-17 DIAGNOSIS — F17210 Nicotine dependence, cigarettes, uncomplicated: Secondary | ICD-10-CM | POA: Diagnosis not present

## 2018-10-18 DIAGNOSIS — R9431 Abnormal electrocardiogram [ECG] [EKG]: Secondary | ICD-10-CM | POA: Diagnosis not present

## 2018-10-26 ENCOUNTER — Encounter: Payer: Self-pay | Admitting: Cardiology

## 2018-11-11 ENCOUNTER — Encounter: Payer: Self-pay | Admitting: Cardiology

## 2018-11-11 ENCOUNTER — Ambulatory Visit (INDEPENDENT_AMBULATORY_CARE_PROVIDER_SITE_OTHER): Payer: Medicare Other | Admitting: Cardiology

## 2018-11-11 VITALS — BP 126/68 | HR 75 | Ht 68.0 in | Wt 157.8 lb

## 2018-11-11 DIAGNOSIS — I10 Essential (primary) hypertension: Secondary | ICD-10-CM

## 2018-11-11 DIAGNOSIS — I2119 ST elevation (STEMI) myocardial infarction involving other coronary artery of inferior wall: Secondary | ICD-10-CM

## 2018-11-11 DIAGNOSIS — F17200 Nicotine dependence, unspecified, uncomplicated: Secondary | ICD-10-CM | POA: Diagnosis not present

## 2018-11-11 DIAGNOSIS — E785 Hyperlipidemia, unspecified: Secondary | ICD-10-CM | POA: Diagnosis not present

## 2018-11-11 DIAGNOSIS — I25118 Atherosclerotic heart disease of native coronary artery with other forms of angina pectoris: Secondary | ICD-10-CM

## 2018-11-11 MED ORDER — NICOTINE 7 MG/24HR TD PT24: 7.0000 mg | MEDICATED_PATCH | Freq: Every day | TRANSDERMAL | 0 refills | Status: DC

## 2018-11-11 NOTE — Progress Notes (Signed)
Cardiology Office Note:    Date:  11/11/2018   ID:  Darius Patrick, DOB 1951-06-03, MRN [id number]  PCP:  Darius Patrick, No Pcp Per  Cardiologist:  Larae Grooms, MD  Referring MD: No ref. provider found   Chief Complaint  Darius Patrick presents with  . Hospitalization Follow-up    STEMI, s/p cath    History of Present Illness:    Darius Patrick is a 68 y.o. male with a past medical history significant for CAD status post inferior MI 04/2018 with chronically occluded RCA, hypertension and ongoing tobacco use.  Darius Patrick presented to [facility] on 10/07/2018 with complaints of chest pressure similar to the discomfort he experienced prior to his MI last year.  His EKG showed inferior ST elevation representative of a STEMI or old MI.  There were also deep Q waves in the inferior leads.  He admitted that since his MI the previous year he had not taken any medications for about a week because of finances.  [name] was taken for left heart cath on 10/17/2018 and found to have chronically occluded RCA and otherwise mild, nonobstructive CAD.  LV systolic function was moderately reduced with EF 40% and inferior wall hypokinesis.  An attempt was made to wire the RCA, but was unsuccessful.  The provider in [facility] noted that on prior cath the year before the Darius Patrick was noted to have left to right collaterals.  He suspected that the Darius Patrick may have infarcted his collaterals this time.  His hospitalization was complicated by acute kidney injury felt secondary to contrast nephropathy and improved by discharge.  ACE inhibitor therapy was put on hold with plan to reinitiate if kidney function stable at follow-up. Troponin was up to 18.89.  Echocardiogram also showed EF of 45% with inferior hypokinesis.  Today Darius Patrick is here alone for hospital follow-up.  He is feeling good. He walks everyday ~1/2 mile with his great grandkids.They keep him busy. He has had no chest pain since discharge or shortness of breath.  He denies orthopnea, edema, lightheadedness, palpitations. He occ awakens with shortness of breath. He does not sleep much more than 2-3 hours at a time. He takes Trazadone that used to help but is not helping now.   Still smoking but is trying to cut down. Used to smoke 3-4 packs per week. Now down to ~1.5 packs per week, 4-5 cigarettes per day. He is working on complete cessation. He would like nicotine patches.   He is an alcoholic. He uses to drink liquor heavily. Now he only drinks about a 6 pack of beer per week. He feels like giving up liquor has helped him feel better.   He lives alone but his ex-wife lives next door. He is a retired Administrator.   Past Medical History:  Diagnosis Date  . Acid reflux   . Anxiety   . Hypertension     Past Surgical History:  Procedure Laterality Date  . LEFT HEART CATH AND CORONARY ANGIOGRAPHY N/A 04/28/2018   Procedure: LEFT HEART CATH AND CORONARY ANGIOGRAPHY;  Surgeon: Jettie Booze, MD;  Location: [facility];  Service: Cardiovascular;  Laterality: N/A;    Current Medications: Current Meds  Medication Sig  . aspirin EC 81 MG tablet Take 1 tablet (81 mg total) by mouth daily.  Marland Kitchen atorvastatin (LIPITOR) 80 MG tablet Take 1 tablet (80 mg total) by mouth daily at 6 PM.  . citalopram (CELEXA) 10 MG tablet Take 10 mg  by mouth daily.  . clopidogrel (PLAVIX) 75 MG tablet Take 1 tablet (75 mg total) by mouth daily with breakfast.  . folic acid (FOLVITE) 1 MG tablet Take 1 mg by mouth daily.  . magnesium oxide (MAG-OX) 400 (241.3 Mg) MG tablet Take 0.5 tablets (200 mg total) by mouth daily.  . metoprolol tartrate (LOPRESSOR) 25 MG tablet Take 0.5 tablets (12.5 mg total) by mouth 2 (two) times daily.  . nitroGLYCERIN (NITROSTAT) 0.4 MG SL tablet Place 1 tablet (0.4 mg total) under the tongue every 5 (five) minutes x 3 doses as needed for chest pain.  . traZODone (DESYREL) 100 MG tablet Take 100-200 mg by mouth at bedtime.  . vitamin B-12  (CYANOCOBALAMIN) 1000 MCG tablet Take 1,000 mcg by mouth daily.     Allergies:   Darius Patrick has no known allergies.   Social History   Socioeconomic History  . Marital status: Divorced    Spouse name: Not on file  . Number of children: Not on file  . Years of education: Not on file  . Highest education level: Not on file  Occupational History  . Occupation: Retired Forensic scientist  . Financial resource strain: Not on file  . Food insecurity:    Worry: Not on file    Inability: Not on file  . Transportation needs:    Medical: Not on file    Non-medical: Not on file  Tobacco Use  . Smoking status: Current Every Day Smoker    Packs/day: 0.50  . Smokeless tobacco: Never Used  Substance and Sexual Activity  . Alcohol use: Yes    Alcohol/week: 6.0 standard drinks    Types: 3 Cans of beer, 3 Glasses of wine per week    Comment: a few beers and a pint of wine a week.  . Drug use: Not Currently    Comment: not since the 70s  . Sexual activity: Not on file  Lifestyle  . Physical activity:    Days per week: Not on file    Minutes per session: Not on file  . Stress: Not on file  Relationships  . Social connections:    Talks on phone: Not on file    Gets together: Not on file    Attends religious service: Not on file    Active member of club or organization: Not on file    Attends meetings of clubs or organizations: Not on file    Relationship status: Not on file  Other Topics Concern  . Not on file  Social History Narrative   Pt lives alone in [place], [place].      Family History: The Darius Patrick's family history includes Heart attack (age of onset: 89) in his father; Heart disease in his brother; Hypertension in his sister; Hypertension (age of onset: 12) in his mother. ROS:   Please see the history of present illness.     All other systems reviewed and are negative.  EKGs/Labs/Other Studies Reviewed:    The following studies were reviewed  today:  Left heart cath 10/17/2018 done at [facility] regional INDICATION: Chest pain, abnormal EKG, possible inferior STEMI. 1. Chronically occluded RCA (not a STEMI). 2. Otherwise mild, nonobstructive CAD. 3. Moderately reduced LV systolic function, EF 97% 4. Inferior wall hypokinesis. Diagnostic Recommendations 1. Recovery in hospital. 2. Continue aggressive CVD risk factor modification. 3. Smoking cessation. 4. Social work consult to help with medications.  Signatures  Electronically signed by Bishop Limbo, DO, FACC(Interventional  Physician) on 10/17/2018 23:38  Echocardiogram 10/18/2018 done at [facility] regional Summary Mild mitral annular calcification. Mild mitral regurgitation. Ejection fraction is 45% Inferior wall is hypokinetic.  Findings Mitral Valve Mild mitral annular calcification. Mild mitral regurgitation. Aortic Valve Structurally normal aortic valve with good leaflet mobility, and no regurgitation. Tricuspid Valve Tricuspid valve is structurally normal. Trace tricuspid regurgitation. Pulmonic Valve Pulmonic valve is structurally normal. No Doppler evidence of pulmonic stenosis or insufficiency. Left Atrium Normal size left atrium. Left Ventricle Ejection fraction is 45% Inferior wall is hypokinetic. Right Atrium Normal right atrium. Right Ventricle Normal right ventricular size and function. Pericardial Effusion No evidence of pericardial effusion.  EKG:  EKG is not ordered today.    Recent Labs: 05/01/2018: BUN 11; Creatinine, Ser 1.06; Magnesium 1.4; Potassium 4.1; Sodium 134 06/22/2018: ALT 9; Hemoglobin 13.4; Platelets 284   Recent Lipid Panel    Component Value Date/Time   CHOL 86 (L) 06/22/2018 0826   TRIG 54 06/22/2018 0826   HDL 41 06/22/2018 0826   CHOLHDL 2.1 06/22/2018 0826   CHOLHDL 2.9 04/29/2018 0202   VLDL 15 04/29/2018 0202   LDLCALC 34 06/22/2018 0826    Physical Exam:    VS:  BP 126/68   Pulse 75   Ht 5\' 8"   (1.727 m)   Wt 157 lb 12.8 oz (71.6 kg)   SpO2 96%   BMI 23.99 kg/m     Wt Readings from Last 3 Encounters:  11/11/18 157 lb 12.8 oz (71.6 kg)  09/23/18 159 lb 3.2 oz (72.2 kg)  06/22/18 151 lb 6.4 oz (68.7 kg)     Physical Exam  Constitutional: He is oriented to person, place, and time. He appears well-developed and well-nourished.  HENT:  Head: Atraumatic.  Eyes:  BiLateral arcus senilis and deviated alignment  Neck: Normal range of motion. Neck supple. No JVD present.  Cardiovascular: Normal rate, regular rhythm, normal heart sounds and intact distal pulses. Exam reveals no gallop and no friction rub.  No murmur heard. Pulmonary/Chest: Effort normal and breath sounds normal. No respiratory distress. He has no wheezes. He has no rales.  Abdominal: Soft. Bowel sounds are normal.  Musculoskeletal: Normal range of motion.        General: No edema.  Neurological: He is alert and oriented to person, place, and time.  Skin: Skin is warm and dry.  Psychiatric: He has a normal mood and affect. His behavior is normal. Judgment and thought content normal.  Vitals reviewed.   ASSESSMENT:    1. Acute ST elevation myocardial infarction (STEMI) of inferior wall (HCC)   2. Coronary artery disease involving native coronary artery of native heart with other form of angina pectoris ([place])   3. Hypertension, unspecified type   4. Hyperlipidemia LDL goal <70   5. Smoker    PLAN:    In order of problems listed above:  CAD s/p inferior STEMI -Darius Patrick with inferior MI last year and presented with inferior STEMI 10/17/2018 in [facility].  Cardiac cath confirmed known chronic RCA occlusion.  Questionable infarction to his collaterals. -Darius Patrick had not been taking his medications.  He has been restarted on guideline directed medical therapy including beta-blocker, aspirin, Plavix, statin.  He states that he is having no problems obtaining his medications currently and has been compliant. -Darius Patrick  is feeling very well with no further chest pain or shortness of breath. -Continue current therapy  Ischemic cardiomyopathy -Newly reduced EF of 45% per recent cath and echo.  Darius Patrick  appears volume stable. -He is on low-dose beta-blocker.  ACE inhibitor was held in the hospital due to acute kidney injury.  Will recheck metabolic panel today and if renal function normal will initiate losartan 25 mg daily.  Hypertension -Blood pressure well controlled.  As above if renal function stable will initiate ARB.  Hyperlipidemia -The Darius Patrick had not been previously taking his statin.  Atorvastatin 80 mg was started at recent hospitalization. -Will recheck lipid panel and LFTs in early March evaluate therapy.  LDL goal is <70.   Tobacco abuse -Darius Patrick has been cutting down on his smoking, now down to 4-5 cigarettes/day.  He would like a nicotine patch.  I will prescribe the 7 mg nicotine patches and discussed heavier changes to help with smoking cessation.  Medication Adjustments/Labs and Tests Ordered: Current medicines are reviewed at length with the Darius Patrick today.  Concerns regarding medicines are outlined above. Labs and tests ordered and medication changes are outlined in the Darius Patrick instructions below:  Darius Patrick Instructions  Medication Instructions:  START: Nicotine Patch 7 mg daily   If you need a refill on your cardiac medications before your next appointment, please call your pharmacy.   Lab work: TODAY: BMET   Your physician recommends that you return for a FASTING lipid profile and hepatic function test on 12/06/2018  If you have labs (blood work) drawn today and your tests are completely normal, you will receive your results only by: Marland Kitchen MyChart Message (if you have MyChart) OR . A paper copy in the mail If you have any lab test that is abnormal or we need to change your treatment, we will call you to review the results.  Testing/Procedures: None  Follow-Up: At [facility], you  and your health needs are our priority.  As part of our continuing mission to provide you with exceptional heart care, we have created designated Provider Care Teams.  These Care Teams include your primary Cardiologist (physician) and Advanced Practice Providers (APPs -  Physician Assistants and Nurse Practitioners) who all work together to provide you with the care you need, when you need it. You will need a follow up appointment in 4-5 months.  Please call our office 2 months in advance to schedule this appointment.  You may see Larae Grooms, MD or one of the following Advanced Practice Providers on your designated Care Team:   Dibble, PA-C Melina Copa, PA-C . Ermalinda Barrios, PA-C  Any Other Special Instructions Will Be Listed Below (If Applicable).  Coping with Quitting Smoking  Quitting smoking is a physical and mental challenge. You will face cravings, withdrawal symptoms, and temptation. Before quitting, work with your health care provider to make a plan that can help you cope. Preparation can help you quit and keep you from giving in. How can I cope with cravings? Cravings usually last for 5-10 minutes. If you get through it, the craving will pass. Consider taking the following actions to help you cope with cravings:  Keep your mouth busy: ? Chew sugar-free gum. ? Suck on hard candies or a straw. ? Brush your teeth.  Keep your hands and body busy: ? Immediately change to a different activity when you feel a craving. ? Squeeze or play with a ball. ? Do an activity or a hobby, like making bead jewelry, practicing needlepoint, or working with wood. ? Mix up your normal routine. ? Take a short exercise break. Go for a quick walk or run up and down stairs. ? Spend time  in public places where smoking is not allowed.  Focus on doing something kind or helpful for someone else.  Call a friend or family member to talk during a craving.  Join a support group.  Call a quit  line, such as 1-800-QUIT-NOW.  Talk with your health care provider about medicines that might help you cope with cravings and make quitting easier for you. How can I deal with withdrawal symptoms? Your body may experience negative effects as it tries to get used to not having nicotine in the system. These effects are called withdrawal symptoms. They may include:  Feeling hungrier than normal.  Trouble concentrating.  Irritability.  Trouble sleeping.  Feeling depressed.  Restlessness and agitation.  Craving a cigarette. To manage withdrawal symptoms:  Avoid places, people, and activities that trigger your cravings.  Remember why you want to quit.  Get plenty of sleep.  Avoid coffee and other caffeinated drinks. These may worsen some of your symptoms. How can I handle social situations? Social situations can be difficult when you are quitting smoking, especially in the first few weeks. To manage this, you can:  Avoid parties, bars, and other social situations where people might be smoking.  Avoid alcohol.  Leave right away if you have the urge to smoke.  Explain to your family and friends that you are quitting smoking. Ask for understanding and support.  Plan activities with friends or family where smoking is not an option. What are some ways I can cope with stress? Wanting to smoke may cause stress, and stress can make you want to smoke. Find ways to manage your stress. Relaxation techniques can help. For example:  Breathe slowly and deeply, in through your nose and out through your mouth.  Listen to soothing, relaxing music.  Talk with a family member or friend about your stress.  Light a candle.  Soak in a bath or take a shower.  Think about a peaceful place. What are some ways I can prevent weight gain? Be aware that many people gain weight after they quit smoking. However, not everyone does. To keep from gaining weight, have a plan in place before you quit  and stick to the plan after you quit. Your plan should include:  Having healthy snacks. When you have a craving, it may help to: ? Eat plain popcorn, crunchy carrots, celery, or other cut vegetables. ? Chew sugar-free gum.  Changing how you eat: ? Eat small portion sizes at meals. ? Eat 4-6 small meals throughout the day instead of 1-2 large meals a day. ? Be mindful when you eat. Do not watch television or do other things that might distract you as you eat.  Exercising regularly: ? Make time to exercise each day. If you do not have time for a long workout, do short bouts of exercise for 5-10 minutes several times a day. ? Do some form of strengthening exercise, like weight lifting, and some form of aerobic exercise, like running or swimming.  Drinking plenty of water or other low-calorie or no-calorie drinks. Drink 6-8 glasses of water daily, or as much as instructed by your health care provider. Summary  Quitting smoking is a physical and mental challenge. You will face cravings, withdrawal symptoms, and temptation to smoke again. Preparation can help you as you go through these challenges.  You can cope with cravings by keeping your mouth busy (such as by chewing gum), keeping your body and hands busy, and making calls to family, friends, or  a helpline for people who want to quit smoking.  You can cope with withdrawal symptoms by avoiding places where people smoke, avoiding drinks with caffeine, and getting plenty of rest.  Ask your health care provider about the different ways to prevent weight gain, avoid stress, and handle social situations. This information is not intended to replace advice given to you by your health care provider. Make sure you discuss any questions you have with your health care provider. Document Released: 09/19/2016 Document Revised: 09/19/2016 Document Reviewed: 09/19/2016 Elsevier Interactive Darius Patrick Education  2019 [employer] Inc.    Coronary Artery  Disease, Male  Coronary artery disease (CAD) is a condition in which the arteries that lead to the heart (coronary arteries) become narrow or blocked. The narrowing or blockage can lead to decreased blood flow to the heart. Prolonged reduced blood flow can cause a heart attack (myocardial infarction or MI). This condition may also be called coronary heart disease. Because CAD is the leading cause of death in men, it is important to understand what causes this condition and how it is treated. What are the causes? CAD is most often caused by atherosclerosis. This is the buildup of fat and cholesterol (plaque) on the inside of the arteries. Over time, the plaque may narrow or block the artery, reducing blood flow to the heart. Plaque can also become weak and break off within a coronary artery and cause a sudden blockage. Other less common causes of CAD include:  An embolism or blood clot in a coronary artery.  A tearing of the artery (spontaneous coronary artery dissection).  An aneurysm.  Inflammation (vasculitis) in the artery wall. What increases the risk? The following factors may make you more likely to develop this condition:  Age. Men over age 12 are at a greater risk of CAD.  Family history of CAD.  Gender. Men often develop CAD earlier in life than women.  High blood pressure (hypertension).  Diabetes.  High cholesterol levels.  Tobacco use.  Excessive alcohol use.  Lack of exercise.  A diet high in saturated and trans fats, such as fried food and processed meat. Other possible risk factors include:  High stress levels.  Depression.  Obesity.  Sleep apnea. What are the signs or symptoms? Many people do not have any symptoms during the early stages of CAD. As the condition progresses, symptoms may include:  Chest pain (angina). The pain can: ? Feel like a crushing or squeezing, or a tightness, pressure, fullness, or heaviness in the chest. ? Last more than a few  minutes or can stop and recur. The pain tends to get worse with exercise or stress and to fade with rest.  Pain in the arms, neck, jaw, or back.  Unexplained heartburn or indigestion.  Shortness of breath.  Nausea or vomiting.  Sudden light-headedness.  Sudden cold sweats.  Fluttering or fast heartbeat (palpitations). How is this diagnosed? This condition is diagnosed based on:  Your family and medical history.  A physical exam.  Tests, including: ? A test to check the electrical signals in your heart (electrocardiogram). ? Exercise stress test. This looks for signs of blockage when the heart is stressed with exercise, such as running on a treadmill. ? Pharmacologic stress test. This test looks for signs of blockage when the heart is being stressed with a medicine. ? Blood tests. ? Coronary angiogram. This is a procedure to look at the coronary arteries to see if there is any blockage. During this test, a  dye is injected into your arteries so they appear on an X-ray. ? A test that uses sound waves to take a picture of your heart (echocardiogram). ? Chest X-ray. How is this treated? This condition may be treated by:  Healthy lifestyle changes to reduce risk factors.  Medicines such as: ? Antiplatelet medicines and blood-thinning medicines, such as aspirin. These help to prevent blood clots. ? Nitroglycerin. ? Blood pressure medicines. ? Cholesterol-lowering medicine.  Coronary angioplasty and stenting. During this procedure, a thin, flexible tube is inserted through a blood vessel and into a blocked artery. A balloon or similar device on the end of the tube is inflated to open up the artery. In some cases, a small, mesh tube (stent) is inserted into the artery to keep it open.  Coronary artery bypass surgery. During this surgery, veins or arteries from other parts of the body are used to create a bypass around the blockage and allow blood to reach your heart. Follow these  instructions at home: Medicines  Take over-the-counter and prescription medicines only as told by your health care provider.  Do not take the following medicines unless your health care provider approves: ? NSAIDs, such as ibuprofen, naproxen, or celecoxib. ? Vitamin supplements that contain vitamin A, vitamin E, or both. Lifestyle  Follow an exercise program approved by your health care provider. Aim for 150 minutes of moderate exercise or 75 minutes of vigorous exercise each week.  Maintain a healthy weight or lose weight as approved by your health care provider.  Rest when you are tired.  Learn to manage stress or try to limit your stress. Ask your health care provider for suggestions if you need help.  Get screened for depression and seek treatment, if needed.  Do not use any products that contain nicotine or tobacco, such as cigarettes and e-cigarettes. If you need help quitting, ask your health care provider.  Do not use illegal drugs. Eating and drinking  Follow a heart-healthy diet. A dietitian can help educate you about healthy food options and changes. In general, eat plenty of fruits and vegetables, lean meats, and whole grains.  Avoid foods high in: ? Sugar. ? Salt (sodium). ? Saturated fat, such as processed or fatty meat. ? Trans fat, such as fried foods.  Use healthy cooking methods such as roasting, grilling, broiling, baking, poaching, steaming, or stir-frying.  If you drink alcohol, and your health care provider approves, limit your alcohol intake to no more than 2 drinks per day. One drink equals 12 ounces of beer, 5 ounces of wine, or 1 ounces of hard liquor. General instructions  Manage any other health conditions, such as hypertension and diabetes. These conditions affect your heart.  Your health care provider may ask you to monitor your blood pressure. Ideally, your blood pressure should be below 130/80.  Keep all follow-up visits as told by your  health care provider. This is important. Get help right away if:  You have pain in your chest, neck, arm, jaw, stomach, or back that: ? Lasts more than a few minutes. ? Is recurring. ? Is not relieved by taking medicine under your tongue (sublingualnitroglycerin).  You have too much (profuse) sweating without cause.  You have unexplained: ? Heartburn or indigestion. ? Shortness of breath or difficulty breathing. ? Fluttering or fast heartbeat (palpitations). ? Nausea or vomiting. ? Fatigue. ? Feelings of nervousness or anxiety. ? Weakness. ? Diarrhea.  You have sudden light-headedness or dizziness.  You faint.  You feel  like hurting yourself or think about taking your own life. These symptoms may represent a serious problem that is an emergency. Do not wait to see if the symptoms will go away. Get medical help right away. Call your local emergency services (911 in the [place] Do not drive yourself to the hospital. Summary  Coronary artery disease (CAD) is a process in which the arteries that lead to the heart (coronary arteries) become narrow or blocked. The narrowing or blockage can lead to a heart attack.  Many people do not have any symptoms during the early stages of CAD. This is called "silent CAD."  CAD can be treated with lifestyle changes, medicines, surgery, or a combination of these treatments. This information is not intended to replace advice given to you by your health care provider. Make sure you discuss any questions you have with your health care provider. Document Released: 04/19/2014 Document Revised: 09/12/2016 Document Reviewed: 09/12/2016 Elsevier Interactive D[employer] Education  2019 [place], Daune Perch, NP  11/11/2018 5:09 PM    [facility]

## 2018-11-11 NOTE — Patient Instructions (Addendum)
Medication Instructions:  START: Nicotine Patch 7 mg daily   If you need a refill on your cardiac medications before your next appointment, please call your pharmacy.   Lab work: TODAY: BMET   Your physician recommends that you return for a FASTING lipid profile and hepatic function test on 12/06/2018  If you have labs (blood work) drawn today and your tests are completely normal, you will receive your results only by: Marland Kitchen MyChart Message (if you have MyChart) OR . A paper copy in the mail If you have any lab test that is abnormal or we need to change your treatment, we will call you to review the results.  Testing/Procedures: None  Follow-Up: At [facility], you and your health needs are our priority.  As part of our continuing mission to provide you with exceptional heart care, we have created designated Provider Care Teams.  These Care Teams include your primary Cardiologist (physician) and Advanced Practice Providers (APPs -  Physician Assistants and Nurse Practitioners) who all work together to provide you with the care you need, when you need it. You will need a follow up appointment in 4-5 months.  Please call our office 2 months in advance to schedule this appointment.  You may see Larae Grooms, MD or one of the following Advanced Practice Providers on your designated Care Team:   [place], PA-C Melina Copa, PA-C . Ermalinda Barrios, PA-C  Any Other Special Instructions Will Be Listed Below (If Applicable).  Coping with Quitting Smoking  Quitting smoking is a physical and mental challenge. You will face cravings, withdrawal symptoms, and temptation. Before quitting, work with your health care provider to make a plan that can help you cope. Preparation can help you quit and keep you from giving in. How can I cope with cravings? Cravings usually last for 5-10 minutes. If you get through it, the craving will pass. Consider taking the following actions to help you cope with  cravings:  Keep your mouth busy: ? Chew sugar-free gum. ? Suck on hard candies or a straw. ? Brush your teeth.  Keep your hands and body busy: ? Immediately change to a different activity when you feel a craving. ? Squeeze or play with a ball. ? Do an activity or a hobby, like making bead jewelry, practicing needlepoint, or working with wood. ? Mix up your normal routine. ? Take a short exercise break. Go for a quick walk or run up and down stairs. ? Spend time in public places where smoking is not allowed.  Focus on doing something kind or helpful for someone else.  Call a friend or family member to talk during a craving.  Join a support group.  Call a quit line, such as 1-800-QUIT-NOW.  Talk with your health care provider about medicines that might help you cope with cravings and make quitting easier for you. How can I deal with withdrawal symptoms? Your body may experience negative effects as it tries to get used to not having nicotine in the system. These effects are called withdrawal symptoms. They may include:  Feeling hungrier than normal.  Trouble concentrating.  Irritability.  Trouble sleeping.  Feeling depressed.  Restlessness and agitation.  Craving a cigarette. To manage withdrawal symptoms:  Avoid places, people, and activities that trigger your cravings.  Remember why you want to quit.  Get plenty of sleep.  Avoid coffee and other caffeinated drinks. These may worsen some of your symptoms. How can I handle social situations? Social situations  can be difficult when you are quitting smoking, especially in the first few weeks. To manage this, you can:  Avoid parties, bars, and other social situations where people might be smoking.  Avoid alcohol.  Leave right away if you have the urge to smoke.  Explain to your family and friends that you are quitting smoking. Ask for understanding and support.  Plan activities with friends or family where  smoking is not an option. What are some ways I can cope with stress? Wanting to smoke may cause stress, and stress can make you want to smoke. Find ways to manage your stress. Relaxation techniques can help. For example:  Breathe slowly and deeply, in through your nose and out through your mouth.  Listen to soothing, relaxing music.  Talk with a family member or friend about your stress.  Light a candle.  Soak in a bath or take a shower.  Think about a peaceful place. What are some ways I can prevent weight gain? Be aware that many people gain weight after they quit smoking. However, not everyone does. To keep from gaining weight, have a plan in place before you quit and stick to the plan after you quit. Your plan should include:  Having healthy snacks. When you have a craving, it may help to: ? Eat plain popcorn, crunchy carrots, celery, or other cut vegetables. ? Chew sugar-free gum.  Changing how you eat: ? Eat small portion sizes at meals. ? Eat 4-6 small meals throughout the day instead of 1-2 large meals a day. ? Be mindful when you eat. Do not watch television or do other things that might distract you as you eat.  Exercising regularly: ? Make time to exercise each day. If you do not have time for a long workout, do short bouts of exercise for 5-10 minutes several times a day. ? Do some form of strengthening exercise, like weight lifting, and some form of aerobic exercise, like running or swimming.  Drinking plenty of water or other low-calorie or no-calorie drinks. Drink 6-8 glasses of water daily, or as much as instructed by your health care provider. Summary  Quitting smoking is a physical and mental challenge. You will face cravings, withdrawal symptoms, and temptation to smoke again. Preparation can help you as you go through these challenges.  You can cope with cravings by keeping your mouth busy (such as by chewing gum), keeping your body and hands busy, and making  calls to family, friends, or a helpline for people who want to quit smoking.  You can cope with withdrawal symptoms by avoiding places where people smoke, avoiding drinks with caffeine, and getting plenty of rest.  Ask your health care provider about the different ways to prevent weight gain, avoid stress, and handle social situations. This information is not intended to replace advice given to you by your health care provider. Make sure you discuss any questions you have with your health care provider. Document Released: 09/19/2016 Document Revised: 09/19/2016 Document Reviewed: 09/19/2016 Elsevier Interactive Patient Education  2019 [employer]    Coronary Artery Disease, Male  Coronary artery disease (CAD) is a condition in which the arteries that lead to the heart (coronary arteries) become narrow or blocked. The narrowing or blockage can lead to decreased blood flow to the heart. Prolonged reduced blood flow can cause a heart attack (myocardial infarction or MI). This condition may also be called coronary heart disease. Because CAD is the leading cause of death in men, it  is important to understand what causes this condition and how it is treated. What are the causes? CAD is most often caused by atherosclerosis. This is the buildup of fat and cholesterol (plaque) on the inside of the arteries. Over time, the plaque may narrow or block the artery, reducing blood flow to the heart. Plaque can also become weak and break off within a coronary artery and cause a sudden blockage. Other less common causes of CAD include:  An embolism or blood clot in a coronary artery.  A tearing of the artery (spontaneous coronary artery dissection).  An aneurysm.  Inflammation (vasculitis) in the artery wall. What increases the risk? The following factors may make you more likely to develop this condition:  Age. Men over age 90 are at a greater risk of CAD.  Family history of CAD.  Gender. Men  often develop CAD earlier in life than women.  High blood pressure (hypertension).  Diabetes.  High cholesterol levels.  Tobacco use.  Excessive alcohol use.  Lack of exercise.  A diet high in saturated and trans fats, such as fried food and processed meat. Other possible risk factors include:  High stress levels.  Depression.  Obesity.  Sleep apnea. What are the signs or symptoms? Many people do not have any symptoms during the early stages of CAD. As the condition progresses, symptoms may include:  Chest pain (angina). The pain can: ? Feel like a crushing or squeezing, or a tightness, pressure, fullness, or heaviness in the chest. ? Last more than a few minutes or can stop and recur. The pain tends to get worse with exercise or stress and to fade with rest.  Pain in the arms, neck, jaw, or back.  Unexplained heartburn or indigestion.  Shortness of breath.  Nausea or vomiting.  Sudden light-headedness.  Sudden cold sweats.  Fluttering or fast heartbeat (palpitations). How is this diagnosed? This condition is diagnosed based on:  Your family and medical history.  A physical exam.  Tests, including: ? A test to check the electrical signals in your heart (electrocardiogram). ? Exercise stress test. This looks for signs of blockage when the heart is stressed with exercise, such as running on a treadmill. ? Pharmacologic stress test. This test looks for signs of blockage when the heart is being stressed with a medicine. ? Blood tests. ? Coronary angiogram. This is a procedure to look at the coronary arteries to see if there is any blockage. During this test, a dye is injected into your arteries so they appear on an X-ray. ? A test that uses sound waves to take a picture of your heart (echocardiogram). ? Chest X-ray. How is this treated? This condition may be treated by:  Healthy lifestyle changes to reduce risk factors.  Medicines such as: ? Antiplatelet  medicines and blood-thinning medicines, such as aspirin. These help to prevent blood clots. ? Nitroglycerin. ? Blood pressure medicines. ? Cholesterol-lowering medicine.  Coronary angioplasty and stenting. During this procedure, a thin, flexible tube is inserted through a blood vessel and into a blocked artery. A balloon or similar device on the end of the tube is inflated to open up the artery. In some cases, a small, mesh tube (stent) is inserted into the artery to keep it open.  Coronary artery bypass surgery. During this surgery, veins or arteries from other parts of the body are used to create a bypass around the blockage and allow blood to reach your heart. Follow these instructions at home: Medicines  Take over-the-counter and prescription medicines only as told by your health care provider.  Do not take the following medicines unless your health care provider approves: ? NSAIDs, such as ibuprofen, naproxen, or celecoxib. ? Vitamin supplements that contain vitamin A, vitamin E, or both. Lifestyle  Follow an exercise program approved by your health care provider. Aim for 150 minutes of moderate exercise or 75 minutes of vigorous exercise each week.  Maintain a healthy weight or lose weight as approved by your health care provider.  Rest when you are tired.  Learn to manage stress or try to limit your stress. Ask your health care provider for suggestions if you need help.  Get screened for depression and seek treatment, if needed.  Do not use any products that contain nicotine or tobacco, such as cigarettes and e-cigarettes. If you need help quitting, ask your health care provider.  Do not use illegal drugs. Eating and drinking  Follow a heart-healthy diet. A dietitian can help educate you about healthy food options and changes. In general, eat plenty of fruits and vegetables, lean meats, and whole grains.  Avoid foods high in: ? Sugar. ? Salt (sodium). ? Saturated fat,  such as processed or fatty meat. ? Trans fat, such as fried foods.  Use healthy cooking methods such as roasting, grilling, broiling, baking, poaching, steaming, or stir-frying.  If you drink alcohol, and your health care provider approves, limit your alcohol intake to no more than 2 drinks per day. One drink equals 12 ounces of beer, 5 ounces of wine, or 1 ounces of hard liquor. General instructions  Manage any other health conditions, such as hypertension and diabetes. These conditions affect your heart.  Your health care provider may ask you to monitor your blood pressure. Ideally, your blood pressure should be below 130/80.  Keep all follow-up visits as told by your health care provider. This is important. Get help right away if:  You have pain in your chest, neck, arm, jaw, stomach, or back that: ? Lasts more than a few minutes. ? Is recurring. ? Is not relieved by taking medicine under your tongue (sublingualnitroglycerin).  You have too much (profuse) sweating without cause.  You have unexplained: ? Heartburn or indigestion. ? Shortness of breath or difficulty breathing. ? Fluttering or fast heartbeat (palpitations). ? Nausea or vomiting. ? Fatigue. ? Feelings of nervousness or anxiety. ? Weakness. ? Diarrhea.  You have sudden light-headedness or dizziness.  You faint.  You feel like hurting yourself or think about taking your own life. These symptoms may represent a serious problem that is an emergency. Do not wait to see if the symptoms will go away. Get medical help right away. Call your local emergency services (911 in the [place] Do not drive yourself to the hospital. Summary  Coronary artery disease (CAD) is a process in which the arteries that lead to the heart (coronary arteries) become narrow or blocked. The narrowing or blockage can lead to a heart attack.  Many people do not have any symptoms during the early stages of CAD. This is called "silent  CAD."  CAD can be treated with lifestyle changes, medicines, surgery, or a combination of these treatments. This information is not intended to replace advice given to you by your health care provider. Make sure you discuss any questions you have with your health care provider. Document Released: 04/19/2014 Document Revised: 09/12/2016 Document Reviewed: 09/12/2016 Elsevier Interactive Patient Education  2019 [employer].

## 2018-11-12 ENCOUNTER — Telehealth: Payer: Self-pay

## 2018-11-12 DIAGNOSIS — I10 Essential (primary) hypertension: Secondary | ICD-10-CM

## 2018-11-12 LAB — BASIC METABOLIC PANEL
BUN / CREAT RATIO: 11 (ref 10–24)
BUN: 14 mg/dL (ref 8–27)
CO2: 23 mmol/L (ref 20–29)
CREATININE: 1.25 mg/dL (ref 0.76–1.27)
Calcium: 9.5 mg/dL (ref 8.6–10.2)
Chloride: 99 mmol/L (ref 96–106)
GFR, EST AFRICAN AMERICAN: 69 mL/min/{1.73_m2} (ref >59)
GFR, EST NON AFRICAN AMERICAN: 60 mL/min/{1.73_m2} (ref >59)
Glucose: 80 mg/dL (ref 65–99)
Potassium: 4.2 mmol/L (ref 3.5–5.2)
SODIUM: 137 mmol/L (ref 134–144)

## 2018-11-12 NOTE — Telephone Encounter (Signed)
Per Pecolia Ades NP pt needs bmet in 3 weeks. Pt is already scheduled to have labs done on 12/06/2018, will add bmet to that appointment.

## 2018-12-06 ENCOUNTER — Other Ambulatory Visit: Payer: Medicare Other | Admitting: *Deleted

## 2018-12-06 DIAGNOSIS — E785 Hyperlipidemia, unspecified: Secondary | ICD-10-CM | POA: Diagnosis not present

## 2018-12-06 DIAGNOSIS — I10 Essential (primary) hypertension: Secondary | ICD-10-CM | POA: Diagnosis not present

## 2018-12-06 LAB — BASIC METABOLIC PANEL
BUN/Creatinine Ratio: 13 (ref 10–24)
BUN: 15 mg/dL (ref 8–27)
CALCIUM: 9.4 mg/dL (ref 8.6–10.2)
CHLORIDE: 98 mmol/L (ref 96–106)
CO2: 22 mmol/L (ref 20–29)
Creatinine, Ser: 1.12 mg/dL (ref 0.76–1.27)
GFR calc Af Amer: 79 mL/min/{1.73_m2} (ref >59)
GFR calc non Af Amer: 68 mL/min/{1.73_m2} (ref >59)
GLUCOSE: 76 mg/dL (ref 65–99)
Potassium: 4.5 mmol/L (ref 3.5–5.2)
Sodium: 136 mmol/L (ref 134–144)

## 2018-12-06 LAB — HEPATIC FUNCTION PANEL
ALK PHOS: 98 IU/L (ref 39–117)
ALT: 13 IU/L (ref 0–44)
AST: 24 IU/L (ref 0–40)
Albumin: 4.1 g/dL (ref 3.8–4.8)
BILIRUBIN TOTAL: 0.2 mg/dL (ref 0.0–1.2)
Bilirubin, Direct: 0.11 mg/dL (ref 0.00–0.40)
Total Protein: 7.3 g/dL (ref 6.0–8.5)

## 2018-12-06 LAB — LIPID PANEL
Chol/HDL Ratio: 1.9 ratio (ref 0.0–5.0)
Cholesterol, Total: 111 mg/dL (ref 100–199)
HDL: 59 mg/dL (ref >39)
LDL Calculated: 43 mg/dL (ref 0–99)
Triglycerides: 47 mg/dL (ref 0–149)
VLDL Cholesterol Cal: 9 mg/dL (ref 5–40)

## 2019-03-10 ENCOUNTER — Telehealth: Payer: Self-pay

## 2019-03-10 NOTE — Telephone Encounter (Signed)
Left message for patient to call back regarding appointment with Dr. Irish Lack.

## 2019-03-11 ENCOUNTER — Telehealth: Payer: Self-pay

## 2019-03-11 NOTE — Telephone Encounter (Signed)
Called pt to set up evisit. Cell listed does not work and pt was unavailable at work. Unable to leave message.

## 2019-03-11 NOTE — Telephone Encounter (Signed)
Multiple attempts have been made to contact this patient. Left detailed message letting patient know that the appointment for 6/8 was cancelled and rescheduled for 6/15 at 2:00 PM. Instructed for the patient to call back and confirm. Need consent and needs to be set up for [email or web address].

## 2019-03-14 ENCOUNTER — Ambulatory Visit: Payer: Medicare Other | Admitting: Interventional Cardiology

## 2019-03-14 ENCOUNTER — Telehealth: Payer: Self-pay

## 2019-03-14 NOTE — Telephone Encounter (Signed)

## 2019-03-21 NOTE — Progress Notes (Signed)
Virtual Visit via Video Note   This visit type was conducted due to national recommendations for restrictions regarding the COVID-19 Pandemic (e.g. social distancing) in an effort to limit this patient's exposure and mitigate transmission in our community.  Due to his co-morbid illnesses, this patient is at least at moderate risk for complications without adequate follow up.  This format is felt to be most appropriate for this patient at this time.  All issues noted in this document were discussed and addressed.  A limited physical exam was performed with this format.  Please refer to the patient's chart for his consent to telehealth for [facility].   Patient unable to access camera.   Date:  03/22/2019   ID:  Darius Patrick, DOB 15-Jul-1951, MRN [id number]  Patient Location: Home Provider Location: Home  PCP:  Patient, No Pcp Per  Cardiologist:  Larae Grooms, MD  Electrophysiologist:  None   Evaluation Performed:  Follow-Up Visit  Chief Complaint:  CAD  History of Present Illness:    Darius Patrick is a 68 y.o. male with CAD.  He had an inferior MI on 7/19.  RCA could not be engaged and his CP resolved in the cath lab.  No PCI was performed.   Cath in 7/19 showed:  Occluded RCA, not selectively visualized. Heavily calcified and could not be engaged. Left to right collaterals.  Mild to moderate heavily calcific CAD in the left system.  LV end diastolic pressure is normal.  There is no aortic valve stenosis.  Thoracic aorta appeared to show high takeoff of RCA. No dissection noted.  Severe tortuosity of the right subclavian artery with moderate atherosclerosis noted.   Recommend uninterrupted dual antiplatelet therapy with Aspirin 81mg  daily and Clopidogrel 75mg  daily for a minimum of 12 months (ACS - Class I recommendation).  Start beta blocker, statin along with aggressive secondary prevention.   Watch in ICU. Heparin for 24-48 hours after sheath pull.  He  was treated with IV heparin, DAPT w/ ASA and Plavix, BB and high dose statin therapy.Chest pain resolved. Pt tolerated the procedure well. 2D echo showed normal LVEF at 50-55% with hypokinesis of the inferolateral and inferior myocardium + G2DD.Lipid panel showed LDL at 85 mg/dl.m Also of note, he did havesome nonsustained VT as well as 2-1 AV block in the original post STEMI course, which ultimately resolved.  7/19 echo: Left ventricle: The cavity size was normal. Wall thickness was increased in a pattern of mild LVH. Systolic function was normal. The estimated ejection fraction was in the range of 50% to 55%. There is hypokinesis of the inferolateral and inferior myocardium. Features are consistent with a pseudonormal left ventricular filling pattern, with concomitant abnormal relaxation and increased filling pressure (grade 2 diastolic dysfunction). - Mitral valve: Calcified annulus. There was mild regurgitation. - Left atrium: The atrium was mildly dilated. - Pericardium, extracardiac: A trivial pericardial effusion was identified.  Impressions:  - Hypokinesis of the basal inferior and inferolateral walls with overall low normal LV systolic function; mild LVH; moderate diastolic dysfunction; mild LAE; mild MR and TR.  Smoking cessation was encouraged.  He has cut back.  He decreased alcohol intake a lot of MI and is eating better.  He has gained weight.    Darius Patrick presented to [facility] on 10/07/2018 with complaints of chest pressure similar to the discomfort he experienced prior to his MI last year.  His EKG showed inferior ST elevation representative of a STEMI or old  MI.  There were also deep Q waves in the inferior leads.  He admitted that since his MI the previous year he had not taken any medications for about a week because of finances.  [name] was taken for left heart cath on 10/17/2018 and found to have chronically occluded RCA and otherwise  mild, nonobstructive CAD.  LV systolic function was moderately reduced with EF 40% and inferior wall hypokinesis.  An attempt was made to wire the RCA, but was unsuccessful.  The provider in [facility] noted that on prior cath the year before the patient was noted to have left to right collaterals.  He suspected that the patient may have infarcted his collaterals this time.  His hospitalization was complicated by acute kidney injury felt secondary to contrast nephropathy and improved by discharge.  ACE inhibitor therapy was put on hold with plan to reinitiate if kidney function stable at follow-up. Troponin was up to 18.89.  Echocardiogram also showed EF of 45% with inferior hypokinesis.  Since the last visit, he has noted that he has been falling more.  He does not use a cane or walker. It has not happened often.  No bleeding problems.   He continues to try to drink less. He stopped drinking liquor.  He has cut back on cigarettes as well.    Denies : Chest pain. Dizziness. Leg edema. Nitroglycerin use. Orthopnea. Palpitations. Paroxysmal nocturnal dyspnea. Shortness of breath. Syncope.   He does walk for exercise.  He has family close by.   The patient does not have symptoms concerning for COVID-19 infection (fever, chills, cough, or new shortness of breath).    Past Medical History:  Diagnosis Date   Acid reflux    Anxiety    Hypertension    Past Surgical History:  Procedure Laterality Date   LEFT HEART CATH AND CORONARY ANGIOGRAPHY N/A 04/28/2018   Procedure: LEFT HEART CATH AND CORONARY ANGIOGRAPHY;  Surgeon: Jettie Booze, MD;  Location: [place] [facility];  Service: Cardiovascular;  Laterality: N/A;     Current Meds  Medication Sig   aspirin EC 81 MG tablet Take 1 tablet (81 mg total) by mouth daily.   atorvastatin (LIPITOR) 80 MG tablet Take 1 tablet (80 mg total) by mouth daily at 6 PM.   citalopram (CELEXA) 10 MG tablet Take 10 mg by mouth daily.   clopidogrel  (PLAVIX) 75 MG tablet Take 1 tablet (75 mg total) by mouth daily with breakfast.   folic acid (FOLVITE) 1 MG tablet Take 1 mg by mouth daily.   magnesium oxide (MAG-OX) 400 (241.3 Mg) MG tablet Take 0.5 tablets (200 mg total) by mouth daily.   metoprolol tartrate (LOPRESSOR) 25 MG tablet Take 0.5 tablets (12.5 mg total) by mouth 2 (two) times daily.   nicotine (NICODERM CQ - DOSED IN MG/24 HR) 7 mg/24hr patch Place 1 patch (7 mg total) onto the skin daily.   nitroGLYCERIN (NITROSTAT) 0.4 MG SL tablet Place 1 tablet (0.4 mg total) under the tongue every 5 (five) minutes x 3 doses as needed for chest pain.   traZODone (DESYREL) 100 MG tablet Take 100-200 mg by mouth at bedtime.   vitamin B-12 (CYANOCOBALAMIN) 1000 MCG tablet Take 1,000 mcg by mouth daily.     Allergies:   Patient has no known allergies.   Social History   Tobacco Use   Smoking status: Current Every Day Smoker    Packs/day: 0.50   Smokeless tobacco: Never Used  Substance Use  Topics   Alcohol use: Yes    Alcohol/week: 6.0 standard drinks    Types: 3 Cans of beer, 3 Glasses of wine per week    Comment: a few beers and a pint of wine a week.   Drug use: Not Currently    Comment: not since the 67s     Family Hx: The patient's family history includes Heart attack (age of onset: 21) in his father; Heart disease in his brother; Hypertension in his sister; Hypertension (age of onset: 89) in his mother.  ROS:   Please see the history of present illness.    occasional falls All other systems reviewed and are negative.   Prior CV studies:   The following studies were reviewed today:  Labs reviewed; cath results reviewed  Labs/Other Tests and Data Reviewed:    EKG:  No ECG reviewed.  Recent Labs: 05/01/2018: Magnesium 1.4 06/22/2018: Hemoglobin 13.4; Platelets 284 12/06/2018: ALT 13; BUN 15; Creatinine, Ser 1.12; Potassium 4.5; Sodium 136   Recent Lipid Panel Lab Results  Component Value Date/Time   CHOL  111 12/06/2018 07:50 AM   TRIG 47 12/06/2018 07:50 AM   HDL 59 12/06/2018 07:50 AM   CHOLHDL 1.9 12/06/2018 07:50 AM   CHOLHDL 2.9 04/29/2018 02:02 AM   LDLCALC 43 12/06/2018 07:50 AM    Wt Readings from Last 3 Encounters:  03/22/19 155 lb (70.3 kg)  11/11/18 157 lb 12.8 oz (71.6 kg)  09/23/18 159 lb 3.2 oz (72.2 kg)     Objective:    Vital Signs:  BP 140/80    Ht 5\' 8"  (1.727 m)    Wt 155 lb (70.3 kg)    BMI 23.57 kg/m    VITAL SIGNS:  reviewed GEN:  no acute distress RESPIRATORY:  normal respiratory effort, symmetric expansion PSYCH:  normal affect exam limited by phone format  ASSESSMENT & PLAN:    1. CAD/MI: No angina on medical therapy.  Known CTO of RCA, documented on second cath in [facility].  COntinue aggressive secondary prevention.   2. Hyperlipidemia: LDL 43.  Continue lipid lowering therapy. He does most of the cooking for himself.  3. HTN: The current medical regimen is effective;  continue present plan and medications.  Readings improved.  4. Smoking: He needs to stop smoking.  He has ct back and now only smokes when he gets  COVID-19 Education: The signs and symptoms of COVID-19 were discussed with the patient and how to seek care for testing (follow up with PCP or arrange E-visit).  The importance of social distancing was discussed today.  Time:   Today, I have spent 15 minutes with the patient with telehealth technology discussing the above problems.     Medication Adjustments/Labs and Tests Ordered: Current medicines are reviewed at length with the patient today.  Concerns regarding medicines are outlined above.   Tests Ordered: No orders of the defined types were placed in this encounter.   Medication Changes: No orders of the defined types were placed in this encounter.   Follow Up:  Virtual Visit or In Person in 1 year(s)  Signed, Larae Grooms, MD  03/22/2019 11:36 AM    [place]

## 2019-03-22 ENCOUNTER — Encounter: Payer: Self-pay | Admitting: Interventional Cardiology

## 2019-03-22 ENCOUNTER — Telehealth (INDEPENDENT_AMBULATORY_CARE_PROVIDER_SITE_OTHER): Payer: Medicare Other | Admitting: Interventional Cardiology

## 2019-03-22 ENCOUNTER — Other Ambulatory Visit: Payer: Self-pay

## 2019-03-22 VITALS — BP 140/80 | Ht 68.0 in | Wt 155.0 lb

## 2019-03-22 DIAGNOSIS — I25118 Atherosclerotic heart disease of native coronary artery with other forms of angina pectoris: Secondary | ICD-10-CM | POA: Diagnosis not present

## 2019-03-22 DIAGNOSIS — I252 Old myocardial infarction: Secondary | ICD-10-CM | POA: Diagnosis not present

## 2019-03-22 DIAGNOSIS — I10 Essential (primary) hypertension: Secondary | ICD-10-CM

## 2019-03-22 DIAGNOSIS — E785 Hyperlipidemia, unspecified: Secondary | ICD-10-CM | POA: Diagnosis not present

## 2019-03-22 DIAGNOSIS — F17200 Nicotine dependence, unspecified, uncomplicated: Secondary | ICD-10-CM

## 2019-03-22 NOTE — Patient Instructions (Signed)

## 2019-05-06 ENCOUNTER — Observation Stay (HOSPITAL_COMMUNITY)
Admission: EM | Admit: 2019-05-06 | Discharge: 2019-05-07 | Disposition: A | Discharge status: Home / Self Care | Payer: Medicare Other | Attending: Family Medicine | Admitting: Family Medicine

## 2019-05-06 ENCOUNTER — Encounter (HOSPITAL_COMMUNITY): Payer: Self-pay

## 2019-05-06 ENCOUNTER — Emergency Department (HOSPITAL_COMMUNITY): Payer: Medicare Other

## 2019-05-06 ENCOUNTER — Other Ambulatory Visit: Payer: Self-pay

## 2019-05-06 DIAGNOSIS — I10 Essential (primary) hypertension: Secondary | ICD-10-CM | POA: Diagnosis not present

## 2019-05-06 DIAGNOSIS — Z9114 Patient's other noncompliance with medication regimen: Secondary | ICD-10-CM | POA: Diagnosis not present

## 2019-05-06 DIAGNOSIS — Z7982 Long term (current) use of aspirin: Secondary | ICD-10-CM | POA: Insufficient documentation

## 2019-05-06 DIAGNOSIS — U071 COVID-19: Secondary | ICD-10-CM | POA: Insufficient documentation

## 2019-05-06 DIAGNOSIS — E785 Hyperlipidemia, unspecified: Secondary | ICD-10-CM | POA: Diagnosis not present

## 2019-05-06 DIAGNOSIS — K219 Gastro-esophageal reflux disease without esophagitis: Secondary | ICD-10-CM | POA: Diagnosis present

## 2019-05-06 DIAGNOSIS — I252 Old myocardial infarction: Secondary | ICD-10-CM | POA: Diagnosis not present

## 2019-05-06 DIAGNOSIS — Z8249 Family history of ischemic heart disease and other diseases of the circulatory system: Secondary | ICD-10-CM | POA: Diagnosis not present

## 2019-05-06 DIAGNOSIS — R0789 Other chest pain: Secondary | ICD-10-CM | POA: Diagnosis not present

## 2019-05-06 DIAGNOSIS — F17210 Nicotine dependence, cigarettes, uncomplicated: Secondary | ICD-10-CM | POA: Insufficient documentation

## 2019-05-06 DIAGNOSIS — Z79899 Other long term (current) drug therapy: Secondary | ICD-10-CM | POA: Insufficient documentation

## 2019-05-06 DIAGNOSIS — I255 Ischemic cardiomyopathy: Secondary | ICD-10-CM

## 2019-05-06 DIAGNOSIS — Z720 Tobacco use: Secondary | ICD-10-CM

## 2019-05-06 DIAGNOSIS — I2510 Atherosclerotic heart disease of native coronary artery without angina pectoris: Secondary | ICD-10-CM | POA: Diagnosis not present

## 2019-05-06 DIAGNOSIS — F419 Anxiety disorder, unspecified: Secondary | ICD-10-CM | POA: Diagnosis not present

## 2019-05-06 DIAGNOSIS — Z7902 Long term (current) use of antithrombotics/antiplatelets: Secondary | ICD-10-CM | POA: Diagnosis not present

## 2019-05-06 DIAGNOSIS — R079 Chest pain, unspecified: Secondary | ICD-10-CM | POA: Diagnosis present

## 2019-05-06 DIAGNOSIS — F17200 Nicotine dependence, unspecified, uncomplicated: Secondary | ICD-10-CM | POA: Diagnosis present

## 2019-05-06 LAB — CBC
HCT: 38.3 % — ABNORMAL LOW (ref 39.0–52.0)
Hemoglobin: 12.6 g/dL — ABNORMAL LOW (ref 13.0–17.0)
MCH: 27 pg (ref 26.0–34.0)
MCHC: 32.9 g/dL (ref 30.0–36.0)
MCV: 82.2 fL (ref 80.0–100.0)
Platelets: 210 10*3/uL (ref 150–400)
RBC: 4.66 MIL/uL (ref 4.22–5.81)
RDW: 17 % — ABNORMAL HIGH (ref 11.5–15.5)
WBC: 7 10*3/uL (ref 4.0–10.5)
nRBC: 0 % (ref 0.0–0.2)

## 2019-05-06 LAB — BASIC METABOLIC PANEL
Anion gap: 11 (ref 5–15)
BUN: 11 mg/dL (ref 8–23)
CO2: 22 mmol/L (ref 22–32)
Calcium: 9.2 mg/dL (ref 8.9–10.3)
Chloride: 98 mmol/L (ref 98–111)
Creatinine, Ser: 1.24 mg/dL (ref 0.61–1.24)
GFR calc Af Amer: >60 mL/min (ref >60)
GFR calc non Af Amer: >60 mL/min (ref >60)
Glucose, Bld: 86 mg/dL (ref 70–99)
Potassium: 4.3 mmol/L (ref 3.5–5.1)
Sodium: 131 mmol/L — ABNORMAL LOW (ref 135–145)

## 2019-05-06 LAB — TROPONIN I (HIGH SENSITIVITY)
Troponin I (High Sensitivity): 6 ng/L (ref <18)
Troponin I (High Sensitivity): 7 ng/L (ref <18)

## 2019-05-06 MED ORDER — VITAMIN B-12 1000 MCG PO TABS
1000.0000 ug | ORAL_TABLET | Freq: Every day | ORAL | Status: DC
  Administered 2019-05-07: 10:00:00 1000 ug via ORAL
  Filled 2019-05-06: qty 1

## 2019-05-06 MED ORDER — METOPROLOL TARTRATE 12.5 MG HALF TABLET
12.5000 mg | ORAL_TABLET | Freq: Two times a day (BID) | ORAL | Status: DC
  Administered 2019-05-07 (×2): 12.5 mg via ORAL
  Filled 2019-05-06 (×2): qty 1

## 2019-05-06 MED ORDER — HEPARIN SODIUM (PORCINE) 5000 UNIT/ML IJ SOLN
5000.0000 [IU] | Freq: Three times a day (TID) | INTRAMUSCULAR | Status: DC
  Administered 2019-05-07 (×3): 5000 [IU] via SUBCUTANEOUS
  Filled 2019-05-06 (×3): qty 1

## 2019-05-06 MED ORDER — ISOSORBIDE MONONITRATE ER 30 MG PO TB24
30.0000 mg | ORAL_TABLET | Freq: Every day | ORAL | Status: DC
  Administered 2019-05-07: 10:00:00 30 mg via ORAL
  Filled 2019-05-06: qty 1

## 2019-05-06 MED ORDER — ATORVASTATIN CALCIUM 40 MG PO TABS
80.0000 mg | ORAL_TABLET | Freq: Every day | ORAL | Status: DC
  Administered 2019-05-07: 17:00:00 80 mg via ORAL
  Filled 2019-05-06: qty 2

## 2019-05-06 MED ORDER — FOLIC ACID 1 MG PO TABS
1.0000 mg | ORAL_TABLET | Freq: Every day | ORAL | Status: DC
  Administered 2019-05-07: 10:00:00 1 mg via ORAL
  Filled 2019-05-06: qty 1

## 2019-05-06 MED ORDER — ACETAMINOPHEN 325 MG PO TABS: 650.0000 mg | ORAL_TABLET | ORAL | Status: DC | PRN

## 2019-05-06 MED ORDER — LISINOPRIL 5 MG PO TABS
5.0000 mg | ORAL_TABLET | Freq: Every day | ORAL | Status: DC
  Administered 2019-05-07: 10:00:00 5 mg via ORAL
  Filled 2019-05-06: qty 1

## 2019-05-06 MED ORDER — NICOTINE 7 MG/24HR TD PT24
7.0000 mg | MEDICATED_PATCH | Freq: Every day | TRANSDERMAL | Status: DC
  Administered 2019-05-07 (×2): 7 mg via TRANSDERMAL
  Filled 2019-05-06 (×3): qty 1

## 2019-05-06 MED ORDER — MAGNESIUM OXIDE 400 (241.3 MG) MG PO TABS
200.0000 mg | ORAL_TABLET | Freq: Every day | ORAL | Status: DC
  Administered 2019-05-06 – 2019-05-07 (×2): 200 mg via ORAL
  Filled 2019-05-06: qty 1
  Filled 2019-05-06: qty 0.5

## 2019-05-06 MED ORDER — NITROGLYCERIN 0.4 MG SL SUBL: 0.4000 mg | SUBLINGUAL_TABLET | SUBLINGUAL | Status: DC | PRN

## 2019-05-06 MED ORDER — CLOPIDOGREL BISULFATE 75 MG PO TABS
75.0000 mg | ORAL_TABLET | Freq: Every day | ORAL | Status: DC
  Administered 2019-05-07: 10:00:00 75 mg via ORAL
  Filled 2019-05-06 (×2): qty 1

## 2019-05-06 MED ORDER — ONDANSETRON HCL 4 MG/2ML IJ SOLN: 4.0000 mg | Freq: Four times a day (QID) | INTRAMUSCULAR | Status: DC | PRN

## 2019-05-06 NOTE — ED Triage Notes (Signed)
Pt arrives with [facility] c/o chest pain that started around 4:30 pm today. Pt states cp is left sided but radiates down right arm. Pt is a smoker and has chronic cough, hx of 2 MI's last year within 6 months. Pt states he took a total of 5-6 nitros this morning and "helped him" but did not resolve his cp this afternoon.   EMS vitals  138/62 98% O2 on RA 76 HR CBG 84

## 2019-05-06 NOTE — ED Notes (Signed)
ED Provider at bedside. 

## 2019-05-06 NOTE — ED Notes (Signed)
ED TO INPATIENT HANDOFF REPORT  ED Nurse Name and Phone #: Tray Martinique, [phone]  S Name/Age/Gender Darius Patrick 68 y.o. male Room/Bed: 037C/037C  Code Status   Code Status: Full Code  Home/SNF/Other Home Patient oriented to: self, place, time and situation Is this baseline? Yes   Triage Complete: Triage complete  Chief Complaint Chest Pain   Triage Note Pt arrives with [facility] c/o chest pain that started around 4:30 pm today. Pt states cp is left sided but radiates down right arm. Pt is a smoker and has chronic cough, hx of 2 MI's last year within 6 months. Pt states he took a total of 5-6 nitros this morning and "helped him" but did not resolve his cp this afternoon.   EMS vitals  138/62 98% O2 on RA 76 HR CBG 84    Allergies No Known Allergies  Level of Care/Admitting Diagnosis ED Disposition    ED Disposition Condition [facility] Area: [place] [100100]  Level of Care: Telemetry Cardiac [103]  Covid Evaluation: Asymptomatic Screening Protocol (No Symptoms)  Diagnosis: Chest pain [622297]  Admitting Physician: Bryna Colander [[id number]]  Attending Physician: MCDOWELL, Aloha Gell [[id number]  PT Class (Do Not Modify): Observation [104]  PT Acc Code (Do Not Modify): Observation [10022]       B Medical/Surgery History Past Medical History:  Diagnosis Date  . Acid reflux   . Anxiety   . Hypertension    Past Surgical History:  Procedure Laterality Date  . LEFT HEART CATH AND CORONARY ANGIOGRAPHY N/A 04/28/2018   Procedure: LEFT HEART CATH AND CORONARY ANGIOGRAPHY;  Surgeon: Jettie Booze, MD;  Location: [place] [facility];  Service: Cardiovascular;  Laterality: N/A;     A IV Location/Drains/Wounds Patient Lines/Drains/Airways Status   Active Line/Drains/Airways    Name:   Placement date:   Placement time:   Site:   Days:   Peripheral IV 05/06/19 Left Forearm   05/06/19    1837    Forearm   less than 1           Intake/Output Last 24 hours No intake or output data in the 24 hours ending 05/06/19 2225  Labs/Imaging Results for orders placed or performed during the hospital encounter of 05/06/19 (from the past 48 hour(s))  Basic metabolic panel     Status: Abnormal   Collection Time: 05/06/19  6:20 PM  Result Value Ref Range   Sodium 131 (L) 135 - 145 mmol/L   Potassium 4.3 3.5 - 5.1 mmol/L   Chloride 98 98 - 111 mmol/L   CO2 22 22 - 32 mmol/L   Glucose, Bld 86 70 - 99 mg/dL   BUN 11 8 - 23 mg/dL   Creatinine, Ser 1.24 0.61 - 1.24 mg/dL   Calcium 9.2 8.9 - 10.3 mg/dL   GFR calc non Af Amer >60 >60 mL/min   GFR calc Af Amer >60 >60 mL/min   Anion gap 11 5 - 15    Comment: Performed at [facility], [place] [address] [place] [address]  CBC     Status: Abnormal   Collection Time: 05/06/19  6:20 PM  Result Value Ref Range   WBC 7.0 4.0 - 10.5 K/uL   RBC 4.66 4.22 - 5.81 MIL/uL   Hemoglobin 12.6 (L) 13.0 - 17.0 g/dL   HCT 38.3 (L) 39.0 - 52.0 %   MCV 82.2 80.0 - 100.0 fL   MCH 27.0 26.0 -  34.0 pg   MCHC 32.9 30.0 - 36.0 g/dL   RDW 17.0 (H) 11.5 - 15.5 %   Platelets 210 150 - 400 K/uL   nRBC 0.0 0.0 - 0.2 %    Comment: Performed at [facility], [place] [address] [place], [place] [address]  Troponin I (High Sensitivity)     Status: None   Collection Time: 05/06/19  6:20 PM  Result Value Ref Range   Troponin I (High Sensitivity) 6 <18 ng/L    Comment: (NOTE) Elevated high sensitivity troponin I (hsTnI) values and significant  changes across serial measurements may suggest ACS but many other  chronic and acute conditions are known to elevate hsTnI results.  Refer to the "Links" section for chest pain algorithms and additional  guidance. Performed at [facility], [place] [address], [place], [place] [address]   Troponin I (High Sensitivity)     Status: None   Collection Time: 05/06/19  9:22 PM  Result Value Ref Range   Troponin I (High Sensitivity) 7 <18 ng/L     Comment: (NOTE) Elevated high sensitivity troponin I (hsTnI) values and significant  changes across serial measurements may suggest ACS but many other  chronic and acute conditions are known to elevate hsTnI results.  Refer to the "Links" section for chest pain algorithms and additional  guidance. Performed at [facility], [place] [address], [place], [place] [address]    Dg Chest Port 1 View  Result Date: 05/06/2019 CLINICAL DATA:  Chest pain EXAM: PORTABLE CHEST 1 VIEW COMPARISON:  April 28, 2018 FINDINGS: The heart size and mediastinal contours are within normal limits. Aorta is tortuous. Both lungs are clear. The visualized skeletal structures are unremarkable. IMPRESSION: No active disease. Electronically Signed   By: Abelardo Diesel M.D.   On: 05/06/2019 18:20    Pending Labs Unresulted Labs (From admission, onward)    Start     Ordered   05/07/19 0500  Lipid panel  Tomorrow morning,   R     05/06/19 2144   05/07/19 0500  CBC  Tomorrow morning,   R     05/06/19 2144   05/07/19 2637  Basic metabolic panel  Tomorrow morning,   R     05/06/19 2144   05/06/19 2139  HIV antibody (Routine Testing)  Once,   STAT     05/06/19 2141   05/06/19 2139  Creatinine, serum  (heparin)  Once,   STAT    Comments: Baseline for heparin therapy IF NOT ALREADY DRAWN.    05/06/19 2141          Vitals/Pain Today's Vitals   05/06/19 1803 05/06/19 1821 05/06/19 2130  BP: (!) 143/96  (!) 148/112  Pulse: 73    Resp: 12    Temp: 99.1 F (37.3 C)    TempSrc: Oral    SpO2: 96%  98%  Weight:  72.1 kg   Height:  5\' 8"  (1.727 m)     Isolation Precautions No active isolations  Medications Medications  acetaminophen (TYLENOL) tablet 650 mg (has no administration in time range)  ondansetron (ZOFRAN) injection 4 mg (has no administration in time range)  heparin injection 5,000 Units (has no administration in time range)  atorvastatin (LIPITOR) tablet 80 mg (has no administration in time  range)  isosorbide mononitrate (IMDUR) 24 hr tablet 30 mg (has no administration in time range)  metoprolol tartrate (LOPRESSOR) tablet 12.5 mg (has no administration in time range)  nitroGLYCERIN (  NITROSTAT) SL tablet 0.4 mg (has no administration in time range)  nicotine (NICODERM CQ - dosed in mg/24 hr) patch 7 mg (has no administration in time range)  clopidogrel (PLAVIX) tablet 75 mg (has no administration in time range)  folic acid (FOLVITE) tablet 1 mg (has no administration in time range)  vitamin B-12 (CYANOCOBALAMIN) tablet 1,000 mcg (has no administration in time range)  magnesium oxide (MAG-OX) tablet 200 mg (has no administration in time range)    Mobility walks with device High fall risk   Focused Assessments Cardiac Assessment Handoff:    Lab Results  Component Value Date   TROPONINI >65.00 ([place]) 04/29/2018   No results found for: DDIMER Does the Patient currently have chest pain? No     R Recommendations: See Admitting Provider Note  Report given to:   Additional Notes:

## 2019-05-06 NOTE — ED Provider Notes (Signed)
[facility] EMERGENCY DEPARTMENT Provider Note   CSN: [id number] Arrival date & time: 05/06/19  1802     History   Chief Complaint Chief Complaint  Patient presents with  . Chest Pain    HPI Darius Patrick is a 68 y.o. male.     The history is provided by the patient and medical records. No language interpreter was used.     68 year old male with history of CAD, prior history MI status post heart catheterization, hypertension, acid reflux, tobacco use brought here via EMS from home for evaluation of chest pain.  Patient report throughout yesterday he had a crick in his neck which hurts whenever he turns.  He tried a nursing and it did improve.  This morning he was awoke with pain about his chest.  Pain is to the left side of chest, sharp, worsening with moving his arms or with movement.  Pain is waxing and waning without any associated lightheadedness dizziness nausea diaphoresis shortness of breath or cough.  He took approximately 5 sublingual nitro throughout the day hoping to get rid of the pain but it did not go away however now that he is going to the ED his pain is mostly subsided.  He has had prior MI in the past but states this felt different.  He denies any heavy lifting or recent strenuous activities.  He denies any recent sick contact.  Past Medical History:  Diagnosis Date  . Acid reflux   . Anxiety   . Hypertension     Patient Active Problem List   Diagnosis Date Noted  . Hyperlipidemia LDL goal <70 05/01/2018  . CAD (coronary artery disease) 05/01/2018  . Smoker 05/01/2018  . Hypertension   . Acid reflux   . Acute ST elevation myocardial infarction (STEMI) of inferior wall ([place]) 04/28/2018    Past Surgical History:  Procedure Laterality Date  . LEFT HEART CATH AND CORONARY ANGIOGRAPHY N/A 04/28/2018   Procedure: LEFT HEART CATH AND CORONARY ANGIOGRAPHY;  Surgeon: Jettie Booze, MD;  Location: [facility];  Service: Cardiovascular;   Laterality: N/A;        Home Medications    Prior to Admission medications   Medication Sig Start Date End Date Taking? Authorizing Provider  atorvastatin (LIPITOR) 80 MG tablet Take 1 tablet (80 mg total) by mouth daily at 6 PM. 05/01/18   Duke, Tami Lin, PA  citalopram (CELEXA) 10 MG tablet Take 10 mg by mouth daily.    [provider]  clopidogrel (PLAVIX) 75 MG tablet Take 1 tablet (75 mg total) by mouth daily with breakfast. 05/02/18   Duke, Tami Lin, PA  folic acid (FOLVITE) 1 MG tablet Take 1 mg by mouth daily.    [provider]  magnesium oxide (MAG-OX) 400 (241.3 Mg) MG tablet Take 0.5 tablets (200 mg total) by mouth daily. 05/02/18   Duke, Tami Lin, PA  metoprolol tartrate (LOPRESSOR) 25 MG tablet Take 0.5 tablets (12.5 mg total) by mouth 2 (two) times daily. 05/01/18   Duke, Tami Lin, PA  nicotine (NICODERM CQ - DOSED IN MG/24 HR) 7 mg/24hr patch Place 1 patch (7 mg total) onto the skin daily. 11/11/18   Daune Perch, NP  nitroGLYCERIN (NITROSTAT) 0.4 MG SL tablet Place 1 tablet (0.4 mg total) under the tongue every 5 (five) minutes x 3 doses as needed for chest pain. 05/01/18   Duke, Tami Lin, PA  traZODone (DESYREL) 100 MG tablet Take 100-200 mg by mouth  at bedtime.    [provider]  vitamin B-12 (CYANOCOBALAMIN) 1000 MCG tablet Take 1,000 mcg by mouth daily.    [provider]    Family History Family History  Problem Relation Age of Onset  . Hypertension Mother 55  . Heart attack Father 14  . Hypertension Sister   . Heart disease Brother     Social History Social History   Tobacco Use  . Smoking status: Current Every Day Smoker    Packs/day: 0.50  . Smokeless tobacco: Never Used  Substance Use Topics  . Alcohol use: Yes    Alcohol/week: 6.0 standard drinks    Types: 3 Cans of beer, 3 Glasses of wine per week    Comment: a few beers and a pint of wine a week.  . Drug use: Not Currently    Comment:  not since the 70s     Allergies   Patient has no known allergies.   Review of Systems Review of Systems  All other systems reviewed and are negative.    Physical Exam Updated Vital Signs BP (!) 143/96 (BP Location: Right Arm)   Pulse 73   Temp 99.1 F (37.3 C) (Oral)   Resp 12   SpO2 96%   Physical Exam Vitals signs and nursing note reviewed.  Constitutional:      General: He is not in acute distress.    Appearance: He is well-developed.  HENT:     Head: Atraumatic.  Eyes:     Conjunctiva/sclera: Conjunctivae normal.  Neck:     Musculoskeletal: Neck supple.  Cardiovascular:     Rate and Rhythm: Normal rate and regular rhythm.     Pulses: Normal pulses.     Heart sounds: Normal heart sounds.  Pulmonary:     Effort: Pulmonary effort is normal.     Breath sounds: Normal breath sounds.  Abdominal:     General: Abdomen is flat.     Tenderness: There is no abdominal tenderness.  Musculoskeletal:        General: No swelling.  Skin:    Capillary Refill: Capillary refill takes less than 2 seconds.     Findings: No rash.  Neurological:     Mental Status: He is alert and oriented to person, place, and time.  Psychiatric:        Mood and Affect: Mood normal.      ED Treatments / Results  Labs (all labs ordered are listed, but only abnormal results are displayed) Labs Reviewed  BASIC METABOLIC PANEL - Abnormal; Notable for the following components:      Result Value   Sodium 131 (*)    All other components within normal limits  CBC - Abnormal; Notable for the following components:   Hemoglobin 12.6 (*)    HCT 38.3 (*)    RDW 17.0 (*)    All other components within normal limits  TROPONIN I (HIGH SENSITIVITY)  TROPONIN I (HIGH SENSITIVITY)    EKG EKG Interpretation  Date/Time:  Friday May 06 2019 18:02:32 EDT Ventricular Rate:  81 PR Interval:    QRS Duration: 95 QT Interval:  383 QTC Calculation: 445 R Axis:   -73 Text Interpretation:  Sinus  rhythm Inferior infarct, old Confirmed by Lennice Sites [phone]) on 05/06/2019 6:07:47 PM   Radiology Dg Chest Port 1 View  Result Date: 05/06/2019 CLINICAL DATA:  Chest pain EXAM: PORTABLE CHEST 1 VIEW COMPARISON:  April 28, 2018 FINDINGS: The heart size and mediastinal contours are within  normal limits. Aorta is tortuous. Both lungs are clear. The visualized skeletal structures are unremarkable. IMPRESSION: No active disease. Electronically Signed   By: Abelardo Diesel M.D.   On: 05/06/2019 18:20    Procedures Procedures (including critical care time)  Medications Ordered in ED Medications - No data to display   Initial Impression / Assessment and Plan / ED Course  I have reviewed the triage vital signs and the nursing notes.  Pertinent labs & imaging results that were available during my care of the patient were reviewed by me and considered in my medical decision making (see chart for details).        BP (!) 143/96 (BP Location: Right Arm)   Pulse 73   Temp 99.1 F (37.3 C) (Oral)   Resp 12   Ht 5\' 8"  (1.727 m)   Wt 72.1 kg   SpO2 96%   BMI 24.18 kg/m    Final Clinical Impressions(s) / ED Diagnoses   Final diagnoses:  Atypical chest pain    ED Discharge Orders    None     6:32 PM Patient here with left-sided chest pain worsening with movement and sounds atypical for ACS.  He is currently chest pain-free.  His last heart catheterization is approximately a year ago that shows occluded RCA.  Plan to obtain initial labs including troponin, and will consult cardiology for recommendation.  7:40 PM HEART pathway score 5.  Initial trop negative. EKG and labs are reassuring.  Will consult cardiology for recommendation. Care discussed with Dr. Ronnald Nian.   8:28 PM Appreciate consultation from cardiology who agrees to see pt and will determine disposition.   8:42 PM Cardiology agrees to admit pt for obs.     Domenic Moras, PA-C 05/06/19 2043    Lennice Sites,  DO 05/07/19 1535

## 2019-05-06 NOTE — ED Notes (Signed)
Delay in lab draw,  MD at bedside. 

## 2019-05-06 NOTE — H&P (Addendum)
History & Physical    Patient ID: Darius Patrick MRN: [id number], DOB/AGE: 02-29-1951   Admit date: 05/06/2019   Primary Physician: Patient, No Pcp Per Primary Cardiologist: Larae Grooms, MD  Patient Profile    Darius Patrick is a 75 yom with CAD with inferior MI in July 2019; occluded RCA could not be engaged, heavily calcified mild-mod CAD in LAD/LCx, HTN, ICM with EF 45%, ongoing tobacco abuse, ongoing EtOH use who presents due to chest pain.   Past Medical History    Past Medical History:  Diagnosis Date  . Acid reflux   . Anxiety   . Hypertension     Past Surgical History:  Procedure Laterality Date  . LEFT HEART CATH AND CORONARY ANGIOGRAPHY N/A 04/28/2018   Procedure: LEFT HEART CATH AND CORONARY ANGIOGRAPHY;  Surgeon: Jettie Booze, MD;  Location: [facility];  Service: Cardiovascular;  Laterality: N/A;     Allergies  No Known Allergies  History of Present Illness    Darius Patrick is a 72 yom with CAD with inferior MI in July 2019; occluded RCA could not be engaged, heavily calcified mild-mod CAD in LAD/LCx, HTN, ICM with EF 45%, ongoing tobacco abuse, ongoing EtOH use who presents due to chest pain.  The patient has complex cardiac history. He was initially admitted approximately one year ago with inferior MI. Cath at that time showed an occluded RCA which could not be engaged but with L-R collaterals filling the distal vessel. He had mild-moderate disease in the left coronary system. He did not undergo PCI and chest pain resolved. He was subsequently admitted in January to [facility] med center due to chest pain. He was found to have significantly elevated troponin up to 18.9 with an EF of 45% on echocardiogram. LHC at that time showed CTO of the RCA. Attempt was made to wire this vessel but was unsuccessful. Per notes, collateral vessels not as well visualized.   The patient reports he was doing well until he was woken from sleep this morning due to chest  pain. Reports pain occurred when he was stretching his arms over his head and was sharp, stabbing pain in the left chest that lasted approximately 15-20 seconds. At times, also radiated down his right arm. Denies any chest pain at time of prior MIs but reports significant, acute onset SOB at those times. No significant SOB today. Chest pain occurred intermittently throughout the day, always with movement of his arms and not associated with other exertion. No SOB, LEE, orthopnea, PND. Does report occasional palpitations. Continues to take ASA/plavix daily and reports that he has been consistent with these. No dark stools or BRBPR.   Home Medications    Prior to Admission medications   Medication Sig Start Date End Date Taking? Authorizing Provider  aspirin EC 81 MG tablet Take 81 mg by mouth daily.   Yes [provider]  atorvastatin (LIPITOR) 80 MG tablet Take 1 tablet (80 mg total) by mouth daily at 6 PM. 05/01/18  Yes Duke, Tami Lin, PA  clopidogrel (PLAVIX) 75 MG tablet Take 1 tablet (75 mg total) by mouth daily with breakfast. 05/02/18  Yes Duke, Tami Lin, PA  folic acid (FOLVITE) 1 MG tablet Take 1 mg by mouth daily.   Yes [provider]  isosorbide mononitrate (IMDUR) 30 MG 24 hr tablet Take 30 mg by mouth daily.   Yes [provider]  metoprolol tartrate (LOPRESSOR) 25 MG tablet Take 0.5 tablets (12.5 mg total)  by mouth 2 (two) times daily. Patient taking differently: Take 25 mg by mouth daily.  05/01/18  Yes Duke, Tami Lin, PA  traZODone (DESYREL) 100 MG tablet Take 200 mg by mouth at bedtime.    Yes [provider]  vitamin B-12 (CYANOCOBALAMIN) 1000 MCG tablet Take 1,000 mcg by mouth daily.   Yes [provider]  citalopram (CELEXA) 10 MG tablet Take 10 mg by mouth daily.    [provider]  magnesium oxide (MAG-OX) 400 (241.3 Mg) MG tablet Take 0.5 tablets (200 mg total) by mouth daily. Patient not taking: Reported on  05/06/2019 05/02/18   Ledora Bottcher, PA  nicotine (NICODERM CQ - DOSED IN MG/24 HR) 7 mg/24hr patch Place 1 patch (7 mg total) onto the skin daily. Patient not taking: Reported on 05/06/2019 11/11/18   Daune Perch, NP  nitroGLYCERIN (NITROSTAT) 0.4 MG SL tablet Place 1 tablet (0.4 mg total) under the tongue every 5 (five) minutes x 3 doses as needed for chest pain. 05/01/18   Duke, Tami Lin, PA    Family History    Family History  Problem Relation Age of Onset  . Hypertension Mother 23  . Heart attack Father 43  . Hypertension Sister   . Heart disease Brother    He indicated that his mother is deceased. He indicated that his father is deceased. He indicated that his sister is alive. He indicated that both of his brothers are alive. He indicated that his maternal grandmother is deceased. He indicated that his maternal grandfather is deceased. He indicated that his paternal grandmother is deceased. He indicated that his paternal grandfather is deceased.   Social History    Social History   Socioeconomic History  . Marital status: Divorced    Spouse name: Not on file  . Number of children: Not on file  . Years of education: Not on file  . Highest education level: Not on file  Occupational History  . Occupation: Retired Forensic scientist  . Financial resource strain: Not on file  . Food insecurity    Worry: Not on file    Inability: Not on file  . Transportation needs    Medical: Not on file    Non-medical: Not on file  Tobacco Use  . Smoking status: Current Every Day Smoker  . Smokeless tobacco: Never Used  . Tobacco comment: pt reports smoking 1 pk per week  Substance and Sexual Activity  . Alcohol use: Yes    Alcohol/week: 6.0 standard drinks    Types: 3 Glasses of wine, 3 Cans of beer per week    Comment: a few beers and a pint of wine a week.  . Drug use: Not Currently    Comment: not since the 70s  . Sexual activity: Not on file   Lifestyle  . Physical activity    Days per week: Not on file    Minutes per session: Not on file  . Stress: Not on file  Relationships  . Social Herbalist on phone: Not on file    Gets together: Not on file    Attends religious service: Not on file    Active member of club or organization: Not on file    Attends meetings of clubs or organizations: Not on file    Relationship status: Not on file  . Intimate partner violence    Fear of current or ex partner: Not on file  Emotionally abused: Not on file    Physically abused: Not on file    Forced sexual activity: Not on file  Other Topics Concern  . Not on file  Social History Narrative   Pt lives alone in [place], [place].      Review of Systems    General:  No chills, fever, night sweats or weight changes.  Cardiovascular:  As per HPI Dermatological: No rash, lesions/masses Respiratory: Mild, chronic cough for months Urologic: No hematuria, dysuria Abdominal:   No nausea, vomiting, diarrhea, bright red blood per rectum, melena, or hematemesis Neurologic:  No visual changes, wkns, changes in mental status. All other systems reviewed and are otherwise negative except as noted above.  Physical Exam    Blood pressure (!) 143/96, pulse 73, temperature 99.1 F (37.3 C), temperature source Oral, resp. rate 12, height 5\' 8"  (1.727 m), weight 72.1 kg, SpO2 96 %.  General: Pleasant, NAD Psych: Normal affect. Neuro: Alert and oriented X 3. Moves all extremities spontaneously. HEENT: Normal  Neck: Supple without bruits or JVD. Lungs:  Poor movement of air throughout. Scant bilateral crackles at the bases. Otherwise CTAB.  Heart: RRR no s3, s4, or murmurs. Abdomen: Soft, non-tender, non-distended, BS + x 4.  Extremities: No clubbing, cyanosis or edema. DP/PT/Radials 2+ and equal bilaterally.  Labs    High Sensitivity Troponin Recent Labs  Lab 05/06/19 1820  TROPONINIHS 6     Cardiac Enzymes No results for  input(s): TROPONINI in the last 168 hours. No results for input(s): TROPIPOC in the last 168 hours.   Lab Results  Component Value Date   WBC 7.0 05/06/2019   HGB 12.6 (L) 05/06/2019   HCT 38.3 (L) 05/06/2019   MCV 82.2 05/06/2019   PLT 210 05/06/2019    Recent Labs  Lab 05/06/19 1820  NA 131*  K 4.3  CL 98  CO2 22  BUN 11  CREATININE 1.24  CALCIUM 9.2  GLUCOSE 86   Lab Results  Component Value Date   CHOL 111 12/06/2018   HDL 59 12/06/2018   LDLCALC 43 12/06/2018   TRIG 47 12/06/2018   No results found for: [facility]   Radiology Studies    Dg Chest Port 1 View  Result Date: 05/06/2019 CLINICAL DATA:  Chest pain EXAM: PORTABLE CHEST 1 VIEW COMPARISON:  April 28, 2018 FINDINGS: The heart size and mediastinal contours are within normal limits. Aorta is tortuous. Both lungs are clear. The visualized skeletal structures are unremarkable. IMPRESSION: No active disease. Electronically Signed   By: Abelardo Diesel M.D.   On: 05/06/2019 18:20    ECG & Cardiac Imaging    05/06/19 - personally reviewed. Sinus rhythm. Inferior Q waves. No acute ischemic abnormalities.   Cath 10/17/18 at [facility]  Diagnostic Summary INDICATION: Chest pain, abnormal EKG, possible inferior STEMI. 1. Chronically occluded RCA (not a STEMI). 2. Otherwise mild, nonobstructive CAD. 3. Moderately reduced LV systolic function, EF 45% 4. Inferior wall hypokinesis. Diagnostic Recommendations 1. Recovery in hospital. 2. Continue aggressive CVD risk factor modification. 3. Smoking cessation. 4. Social work consult to help with medications.   Angiographic Findings  Cardiac Arteries and Lesion Findings LMCA: Abnormal. Mid - 10% Distal - 20% LAD: Abnormal. Proximal - 40% Mid - 30% Distal - 40% LCx: Abnormal. Mid - 20% Distal - 20% RCA: Abnormal and Chronic occlusion. Ostial - 100%   Lesion on Prox RCA:   Devices used   - Abbott 0.014" x 190cm BMW J-Tip PTCI Guidewire.   -  Abbott 0.014" X 190  WhisperES J-Tip PTCI guidewire.   TTE 10/18/18 Mild mitral annular calcification. Mild mitral regurgitation. Ejection fraction is 45% Inferior wall is hypokinetic.  Assessment & Plan    Darius Patrick is a 89 yom with CAD with inferior MI in July 2019; occluded RCA could not be engaged, heavily calcified mild-mod CAD in LAD/LCx, HTN, ICM with EF 45%, ongoing tobacco abuse, ongoing EtOH use who presents due to atypical chest pain.  # Chest pain # CAD Patient with atypical chest pain, sharp in nature and lasting seconds but severe enough to wake him from sleep and has occurred throughout the day today. ECG without findings concerning for acute ischemia and troponin is flat at 6 -->7. While pain is atypical, patient does have known CAD with CTO of his RCA and moderate, calcified disease in his LAD/LCx distribution.  - Will admit for overnight observation - Cont home ASA/ plavix - Pending chest pain overnight, likely plan for stress test in AM (vs cath on Monday) - Lipid panel - Cont home atorvastatin 80mg  daily - Cont home imdur 30mg  daily - Cont home metoprolol 12.5mg  BID  # ICM Patient with EF of 45% in January of this year with inferior wall hypokinesis in the setting of known CTO of the RCA. Appears euvolemic on admission - TTE as above - Cont home metoprolol - Will start low dose ACE-I given reduced EF; titrate as BP allows  # HTN BP variable on presentation but now in the 287O systolic - Cont home metoprolol, imdur as above - Add ACEi as above  # Tobacco abuse - tobacco cessation counseling - Patient aware of our recommendation to discontinue tobacco use - Nicotine patch  # EtOH abuse - Discussed ongoing reduction in EtOH use. Currently much lower than prior. Drinks 2x 40 oz beers and some wine per week.   # Cardiac diet  # FULL CODE   Signed, Bryna Colander, MD 05/06/2019, 9:24 PM   Addendum: Notified from nurse that patient's screening COVID test has returned  positive. Discussed patient with internal medicine team and they will take over as the primary team caring for the patient in the morning. Cardiology will remain available to consult as needed.  2:38 AM

## 2019-05-07 ENCOUNTER — Encounter (HOSPITAL_COMMUNITY): Payer: Self-pay

## 2019-05-07 ENCOUNTER — Observation Stay (HOSPITAL_BASED_OUTPATIENT_CLINIC_OR_DEPARTMENT_OTHER): Payer: Medicare Other

## 2019-05-07 DIAGNOSIS — R079 Chest pain, unspecified: Secondary | ICD-10-CM

## 2019-05-07 DIAGNOSIS — R0789 Other chest pain: Secondary | ICD-10-CM | POA: Diagnosis not present

## 2019-05-07 DIAGNOSIS — U071 COVID-19: Secondary | ICD-10-CM | POA: Diagnosis not present

## 2019-05-07 DIAGNOSIS — I25118 Atherosclerotic heart disease of native coronary artery with other forms of angina pectoris: Secondary | ICD-10-CM

## 2019-05-07 LAB — ECHOCARDIOGRAM LIMITED
Height: 68 in
Weight: 2349.22 oz

## 2019-05-07 LAB — LIPID PANEL
Cholesterol: 89 mg/dL (ref 0–200)
HDL: 43 mg/dL (ref >40)
LDL Cholesterol: 38 mg/dL (ref 0–99)
Total CHOL/HDL Ratio: 2.1 RATIO
Triglycerides: 42 mg/dL (ref <150)
VLDL: 8 mg/dL (ref 0–40)

## 2019-05-07 LAB — BASIC METABOLIC PANEL
Anion gap: 9 (ref 5–15)
BUN: 9 mg/dL (ref 8–23)
CO2: 24 mmol/L (ref 22–32)
Calcium: 9.4 mg/dL (ref 8.9–10.3)
Chloride: 100 mmol/L (ref 98–111)
Creatinine, Ser: 1.11 mg/dL (ref 0.61–1.24)
GFR calc Af Amer: >60 mL/min (ref >60)
GFR calc non Af Amer: >60 mL/min (ref >60)
Glucose, Bld: 100 mg/dL — ABNORMAL HIGH (ref 70–99)
Potassium: 3.7 mmol/L (ref 3.5–5.1)
Sodium: 133 mmol/L — ABNORMAL LOW (ref 135–145)

## 2019-05-07 LAB — CBC
HCT: 42.4 % (ref 39.0–52.0)
Hemoglobin: 14.1 g/dL (ref 13.0–17.0)
MCH: 27.2 pg (ref 26.0–34.0)
MCHC: 33.3 g/dL (ref 30.0–36.0)
MCV: 81.7 fL (ref 80.0–100.0)
Platelets: 228 10*3/uL (ref 150–400)
RBC: 5.19 MIL/uL (ref 4.22–5.81)
RDW: 17 % — ABNORMAL HIGH (ref 11.5–15.5)
WBC: 6.4 10*3/uL (ref 4.0–10.5)
nRBC: 0 % (ref 0.0–0.2)

## 2019-05-07 LAB — CREATININE, SERUM
Creatinine, Ser: 1.16 mg/dL (ref 0.61–1.24)
GFR calc Af Amer: >60 mL/min (ref >60)
GFR calc non Af Amer: >60 mL/min (ref >60)

## 2019-05-07 LAB — HIV ANTIBODY (ROUTINE TESTING W REFLEX): HIV Screen 4th Generation wRfx: NONREACTIVE

## 2019-05-07 LAB — SARS CORONAVIRUS 2 BY RT PCR (HOSPITAL ORDER, PERFORMED IN ~~LOC~~ HOSPITAL LAB): SARS Coronavirus 2: POSITIVE — AB

## 2019-05-07 LAB — MRSA PCR SCREENING: MRSA by PCR: NEGATIVE

## 2019-05-07 MED ORDER — PNEUMOCOCCAL VAC POLYVALENT 25 MCG/0.5ML IJ INJ: 0.5000 mL | INJECTION | INTRAMUSCULAR | Status: DC

## 2019-05-07 MED ORDER — ADULT MULTIVITAMIN W/MINERALS CH: 1.0000 | ORAL_TABLET | Freq: Every day | ORAL | Status: DC

## 2019-05-07 MED ORDER — CHLORHEXIDINE GLUCONATE CLOTH 2 % EX PADS: 6.0000 | MEDICATED_PAD | Freq: Every day | CUTANEOUS | Status: DC

## 2019-05-07 MED ORDER — MAGNESIUM OXIDE 400 (241.3 MG) MG PO TABS: 200.0000 mg | ORAL_TABLET | Freq: Every day | ORAL | 1 refills | Status: DC

## 2019-05-07 MED ORDER — FLUOXETINE HCL 20 MG PO CAPS
20.0000 mg | ORAL_CAPSULE | Freq: Every day | ORAL | Status: DC
  Administered 2019-05-07: 20 mg via ORAL
  Filled 2019-05-07: qty 1

## 2019-05-07 MED ORDER — ENSURE ENLIVE PO LIQD: 237.0000 mL | Freq: Two times a day (BID) | ORAL | Status: DC

## 2019-05-07 MED ORDER — CITALOPRAM HYDROBROMIDE 10 MG PO TABS: 10.0000 mg | ORAL_TABLET | Freq: Every day | ORAL | 0 refills | Status: AC

## 2019-05-07 MED ORDER — LISINOPRIL 5 MG PO TABS: 5.0000 mg | ORAL_TABLET | Freq: Every day | ORAL | 0 refills | Status: AC

## 2019-05-07 NOTE — Progress Notes (Signed)
Pt discharged to home on RA. AVS reviewed with pt at 1725. Pt's brother came to pick him up. Belongings sent home with him.

## 2019-05-07 NOTE — Progress Notes (Signed)
CRITICAL VALUE ALERT  Critical Value:  Positive Covid-19  Date & Time Notied:  05/07/2019 at 27  Provider Notified: yes, cardiologist  Orders Received/Actions taken: Pending

## 2019-05-07 NOTE — Progress Notes (Signed)
  Echocardiogram 2D Echocardiogram has been performed.  Darius Patrick 05/07/2019, 9:04 AM

## 2019-05-07 NOTE — Discharge Summary (Signed)
Physician Discharge Summary  Darius Patrick YQM:[id number] DOB: 03/15/1951 DOA: 05/06/2019  PCP: Patient, No Pcp Per  Admit date: 05/06/2019 Discharge date: 05/07/2019  Time spent: 25 minutes  Recommendations for Outpatient Follow-up:  1. Self-isolation requested outpatient 2. Needs labs in 2 to 3 weeks 3. New medication lisinopril-refilled magnesium, citalopram 4. Needs outpatient follow-up for psychiatric and other issues  Discharge Diagnoses:  Principal Problem:   Chest pain Active Problems:   Hypertension   Acid reflux   Hyperlipidemia LDL goal <70   CAD (coronary artery disease)   Smoker   Discharge Condition: Improved but serious  Diet recommendation: Heart healthy  Filed Weights   05/06/19 1821 05/06/19 2332  Weight: 72.1 kg 66.6 kg    History of present illness:  68 year old male Still smoker Prior heavy drinker  STEMI/old MI 10/25/2018 [facility]-left heart cath 10/17/2018-that hospitalization [facility], echo/LHC EF about 40%-chronically occluded RCA on cath Documented medical noncompliance secondary to finances HTN, HLD  Admitted with chest pain 7/31 by cardiology-found to have flat troponin trend placed on home aspirin Plavix and the plan was for stress test versus cardiac cath Eventually been found to have a COVID-19 and hospitalist consulted  Cardiology saw the patient felt that his symptoms were not typical for angina and somewhat reproducible-echocardiogram was performed as per below that was not concerning for wall motion abnormality and cardiologist felt that the patient could return home and follow-up in the outpatient setting Patient stabilized from coronavirus perspective no oxygen requirement walked around the unit room x3 without any desaturation felt good without any shortness of breath no diarrhea no myalgias Patient counseled and instructed to return to hospital with warning signs specific to coronavirus Met maximal hospital benefit from  admission    Procedures:  Echocardiogram 8/1 IMPRESSIONS    1. The left ventricle has low normal systolic function, with an ejection fraction of 50-55%. The cavity size was normal. There is mildly increased left ventricular wall thickness. Left ventricular diastolic Doppler parameters are consistent with  indeterminate diastolic dysfunction.  2. The right ventricle has normal systolc function. The cavity was normal. There is no increase in right ventricular wall thickness. Right ventricular systolic pressure is normal.  3. Mild thickening of the aortic valve Mild calcification of the aortic valve. No stenosis of the aortic valve. Mild aortic annular calcification noted.  4. Mild thickening of the mitral valve leaflet. Mild calcification of the mitral valve leaflet. There is mild mitral annular calcification present. No evidence of mitral valve stenosis.  5. Pulmonary hypertension is indeterminate, inadequate TR jet.  6. The interatrial septum was not well visualized.  Consultations:  Cardiology  Discharge Exam: Vitals:   05/07/19 1500 05/07/19 1515  BP:  113/87  Pulse: 92 89  Resp: 18 (!) 24  Temp:    SpO2: 100% 99%    General: Awake alert coherent no distress Cardiovascular: S1-S2 no murmur rub or gallop Respiratory: Clinically clear no rales no wheeze no rhonchi Abdomen soft nontender no rebound No lower extremity edema  Discharge Instructions   Discharge Instructions    Diet - low sodium heart healthy   Complete by: As directed    Discharge instructions   Complete by: As directed    Make sure that you self isolate for 14 days do not go outdoors without a mask on and do not expose yourself to people without the coronavirus Ensure that you stop smoking as this will worsen your outcomes I have refilled for you a depression medication that  you are on before in addition to your magnesium in addition you will be started on a new medication called lisinopril which is  used to prevent heart issues and is a blood pressure medicine Please ensure that you get labs in the next 2 to 3 weeks once your virus is over If you have high amounts of oxygen requirements feel sick feels dizzy feel weak feel short of breath return immediately to the emergency room   Increase activity slowly   Complete by: As directed      Allergies as of 05/07/2019   No Known Allergies     Medication List    STOP taking these medications   nicotine 7 mg/24hr patch Commonly known as: NICODERM CQ - dosed in mg/24 hr   traZODone 100 MG tablet Commonly known as: DESYREL     TAKE these medications   aspirin EC 81 MG tablet Take 81 mg by mouth daily.   atorvastatin 80 MG tablet Commonly known as: LIPITOR Take 1 tablet (80 mg total) by mouth daily at 6 PM.   citalopram 10 MG tablet Commonly known as: CELEXA Take 1 tablet (10 mg total) by mouth daily.   clopidogrel 75 MG tablet Commonly known as: PLAVIX Take 1 tablet (75 mg total) by mouth daily with breakfast.   folic acid 1 MG tablet Commonly known as: FOLVITE Take 1 mg by mouth daily.   isosorbide mononitrate 30 MG 24 hr tablet Commonly known as: IMDUR Take 30 mg by mouth daily.   lisinopril 5 MG tablet Commonly known as: ZESTRIL Take 1 tablet (5 mg total) by mouth daily. Start taking on: May 08, 2019   magnesium oxide 400 (241.3 Mg) MG tablet Commonly known as: MAG-OX Take 0.5 tablets (200 mg total) by mouth daily.   metoprolol tartrate 25 MG tablet Commonly known as: LOPRESSOR Take 0.5 tablets (12.5 mg total) by mouth 2 (two) times daily. What changed:   how much to take  when to take this   nitroGLYCERIN 0.4 MG SL tablet Commonly known as: NITROSTAT Place 1 tablet (0.4 mg total) under the tongue every 5 (five) minutes x 3 doses as needed for chest pain.   vitamin B-12 1000 MCG tablet Commonly known as: CYANOCOBALAMIN Take 1,000 mcg by mouth daily.      No Known Allergies    The results of  significant diagnostics from this hospitalization (including imaging, microbiology, ancillary and laboratory) are listed below for reference.    Significant Diagnostic Studies: Dg Chest Port 1 View  Result Date: 05/06/2019 CLINICAL DATA:  Chest pain EXAM: PORTABLE CHEST 1 VIEW COMPARISON:  April 28, 2018 FINDINGS: The heart size and mediastinal contours are within normal limits. Aorta is tortuous. Both lungs are clear. The visualized skeletal structures are unremarkable. IMPRESSION: No active disease. Electronically Signed   By: Abelardo Diesel M.D.   On: 05/06/2019 18:20    Microbiology: Recent Results (from the past 240 hour(s))  SARS Coronavirus 2 [facility] order, Performed in [place] hospital lab)     Status: Abnormal   Collection Time: 05/06/19 10:38 PM  Result Value Ref Range Status   SARS Coronavirus 2 POSITIVE (A) NEGATIVE Final    Comment: RESULT CALLED TO, READ BACK BY AND VERIFIED WITH: M. TEPE,RN 0112 05/07/2019 T. TYSOR (NOTE) If result is NEGATIVE SARS-CoV-2 target nucleic acids are NOT DETECTED. The SARS-CoV-2 RNA is generally detectable in upper and lower  respiratory specimens during the acute phase of infection. The lowest  concentration of  SARS-CoV-2 viral copies this assay can detect is 250  copies / mL. A negative result does not preclude SARS-CoV-2 infection  and should not be used as the sole basis for treatment or other  patient management decisions.  A negative result may occur with  improper specimen collection / handling, submission of specimen other  than nasopharyngeal swab, presence of viral mutation(s) within the  areas targeted by this assay, and inadequate number of viral copies  (<250 copies / mL). A negative result must be combined with clinical  observations, patient history, and epidemiological information. If result is POSITIVE SARS-CoV-2 target nucleic acids are DETECTED. T he SARS-CoV-2 RNA is generally detectable in upper and lower   respiratory specimens during the acute phase of infection.  Positive  results are indicative of active infection with SARS-CoV-2.  Clinical  correlation with patient history and other diagnostic information is  necessary to determine patient infection status.  Positive results do  not rule out bacterial infection or co-infection with other viruses. If result is PRESUMPTIVE POSTIVE SARS-CoV-2 nucleic acids MAY BE PRESENT.   A presumptive positive result was obtained on the submitted specimen  and confirmed on repeat testing.  While 2019 novel coronavirus  (SARS-CoV-2) nucleic acids may be present in the submitted sample  additional confirmatory testing may be necessary for epidemiological  and / or clinical management purposes  to differentiate between  SARS-CoV-2 and other Sarbecovirus currently known to infect humans.  If clinically indicated additional testing with an alternate test  methodology [phone]) is  advised. The SARS-CoV-2 RNA is generally  detectable in upper and lower respiratory specimens during the acute  phase of infection. The expected result is Negative. Fact Sheet for Patients:  StrictlyIdeas.no Fact Sheet for Healthcare Providers: BankingDealers.co.za This test is not yet approved or cleared by the [place] FDA and has been authorized for detection and/or diagnosis of SARS-CoV-2 by FDA under an Emergency Use Authorization (EUA).  This EUA will remain in effect (meaning this test can be used) for the duration of the COVID-19 declaration under Section 564(b)(1) of the Act, 21 U.S.C. section 360bbb-3(b)(1), unless the authorization is terminated or revoked sooner. Performed at [facility], [place] [address], [place], [place] [address]   MRSA PCR Screening     Status: None   Collection Time: 05/06/19 11:38 PM   Specimen: Nasopharyngeal  Result Value Ref Range Status   MRSA by PCR NEGATIVE NEGATIVE Final     Comment:        The GeneXpert MRSA Assay (FDA approved for NASAL specimens only), is one component of a comprehensive MRSA colonization surveillance program. It is not intended to diagnose MRSA infection nor to guide or monitor treatment for MRSA infections. Performed at [facility], [place] [address], [place], [place] [address]      Labs: Basic Metabolic Panel: Recent Labs  Lab 05/06/19 1820 05/06/19 2344 05/07/19 0001  NA 131*  --  133*  K 4.3  --  3.7  CL 98  --  100  CO2 22  --  24  GLUCOSE 86  --  100*  BUN 11  --  9  CREATININE 1.24 1.16 1.11  CALCIUM 9.2  --  9.4   Liver Function Tests: No results for input(s): AST, ALT, ALKPHOS, BILITOT, PROT, ALBUMIN in the last 168 hours. No results for input(s): LIPASE, AMYLASE in the last 168 hours. No results for input(s): AMMONIA in the last 168 hours. CBC: Recent Labs  Lab 05/06/19 1820 05/07/19 0001  WBC 7.0 6.4  HGB 12.6* 14.1  HCT 38.3* 42.4  MCV 82.2 81.7  PLT 210 228   Cardiac Enzymes: No results for input(s): CKTOTAL, CKMB, CKMBINDEX, TROPONINI in the last 168 hours. BNP: BNP (last 3 results) No results for input(s): BNP in the last 8760 hours.  ProBNP (last 3 results) No results for input(s): PROBNP in the last 8760 hours.  CBG: No results for input(s): GLUCAP in the last 168 hours.     Signed:  Nita Sells MD   [facility] 05/07/2019, 3:40 PM

## 2019-05-07 NOTE — Progress Notes (Signed)
Initial Nutrition Assessment  RD working remotely.  DOCUMENTATION CODES:   Not applicable  INTERVENTION:   -Once diet is advanced, add: -Ensure Enlive po BID, each supplement provides 350 kcal and 20 grams of protein -MVI with minerals daily  NUTRITION DIAGNOSIS:   Increased nutrient needs related to acute illness(COVID-19) as evidenced by estimated needs.  GOAL:   Patient will meet greater than or equal to 90% of their needs  MONITOR:   PO intake, Supplement acceptance, Diet advancement, Labs, Weight trends, Skin, I & O's  REASON FOR ASSESSMENT:   Malnutrition Screening Tool    ASSESSMENT:   Mr. Grewe is a 19 yom with CAD with inferior MI in July 2019; occluded RCA could not be engaged, heavily calcified mild-mod CAD in LAD/LCx, HTN, ICM with EF 45%, ongoing tobacco abuse, ongoing EtOH use who presents due to chest pain.  Pt admitted with chest pain and CAD.   8/1- test positive for COVID-19  Reviewed I/O's: -400 ml x 24 hours  UOP: 400 ml x 24 hours  Per cardiology notes, pt currently NPO for possible stress test today. Also plan for potential cath on Monday (05/09/19).   Per chart review, pt with history of tobacco and ETOH use. Reviewed wt hx; pt has experienced a 7% wt loss over the past 6 months, which while not significant for time frame, is concerning given COVID-19 diagnosis and ETOH and tobacco use. Once diet is advanced, pt would greatly benefit from addition of nutritional supplements.   Medications reviewed and include folic acid, magnesium oxide, vitamin B-12.   Labs reviewed: Na: 133.   NUTRITION - FOCUSED PHYSICAL EXAM:    Most Recent Value  Orbital Region  Unable to assess  Upper Arm Region  Unable to assess  Thoracic and Lumbar Region  Unable to assess  Buccal Region  Unable to assess  Temple Region  Unable to assess  Clavicle Bone Region  Unable to assess  Clavicle and Acromion Bone Region  Unable to assess  Scapular Bone Region  Unable to  assess  Dorsal Hand  Unable to assess  Patellar Region  Unable to assess  Anterior Thigh Region  Unable to assess  Posterior Calf Region  Unable to assess  Edema (RD Assessment)  Unable to assess  Hair  Unable to assess  Eyes  Unable to assess  Mouth  Unable to assess  Skin  Unable to assess  Nails  Unable to assess       Diet Order:   Diet Order            Diet NPO time specified  Diet effective midnight              EDUCATION NEEDS:   No education needs have been identified at this time  Skin:  Skin Assessment: Reviewed RN Assessment  Last BM:  05/06/19  Height:   Ht Readings from Last 1 Encounters:  05/06/19 5\' 8"  (1.727 m)    Weight:   Wt Readings from Last 1 Encounters:  05/06/19 66.6 kg    Ideal Body Weight:  70 kg  BMI:  Body mass index is 22.32 kg/m.  Estimated Nutritional Needs:   Kcal:  1800-2000  Protein:  90-105 grams  Fluid:  1.8-2.0 L    Teja Costen A. Jimmye Norman, RD, LDN, [place] Registered Dietitian II Certified Diabetes Care and Education Specialist Pager: [phone] After hours Pager: [phone]

## 2019-05-07 NOTE — Progress Notes (Signed)
Patient transferred to 2M01 with all belongings.

## 2019-05-07 NOTE — Progress Notes (Signed)
   Assessment & Plan    1.CAD/Chest pain - history of inferior MI in July 2019, occluded RCA unable to be engarged managed medically - admit [facility] in January 2020, cath then RCA CTO but also unable to wire across vessel - admitted with chest pain, started when stretching arms above his head. Sharp stabbing pain left sided 15-20 seconds and intermittent, often with arm movement.  - hstrop neg without significant delta. EKG SR inferior Qwaves, chronic lateral TWIs. - echo pending   - atypical symptoms with negative trops. We will f/u echo, no plans for ischemic testing at this time. COVID+management per primary team  2. COVID + COVID + on testing.     For questions or updates, please contact [place] Please consult [email or web address] for contact info under        Signed, Carlyle Dolly, MD  05/07/2019, 11:21 AM

## 2019-05-07 NOTE — Progress Notes (Signed)
Patient transferred to 2M12 from 2C10 via ICU bed.  Patient hooked up to cardiac monitor. VSS. Afebrile. Patient is alert and oriented X4. Pupils equal and reactive. Patient moves all four extremities. Patient walked to new bed with no issues. PIV in LFA C/D/I. Flushes and saline locked. No skin issues noted. Patient has clothing and shoes that were transferred with him. Complaining of 8/10 left sided neck pain. Hot pack applied. Patient given warm blankets and has call bell and urinal within reach. Will continue to monitor.

## 2019-05-07 NOTE — Progress Notes (Signed)
Ambulated pt in his room 3 times. O2 Sat maintained >98%. Pt denies any SOB, dizziness, or fatigue. Will continue to closely monitor pt.

## 2019-05-07 NOTE — Consult Note (Signed)
[facility] Medical Consultation  Darius Patrick EXB:[id number] DOB: 03/24/1951 DOA: 05/06/2019 PCP: Patient, No Pcp Per   Requesting physician: Dr. Verline Lema, cardiology Date of consultation: 05/07/2019 Reason for consultation: Assumption of service  Impression/Recommendations Principal Problem:   Chest pain Active Problems:   Hypertension   Acid reflux   Hyperlipidemia LDL goal <70   CAD (coronary artery disease)   Smoker  #1 chest pain: Patient with known coronary artery disease.  Continue per cardiology.  Work-up ongoing.  #2 COVID-19 positive status: Incidental finding.  Patient is asymptomatic.  No respiratory symptoms.  We will keep in droplet isolation.  Keep oxygen saturation above 94%.  Avoid nonsteroidal anti-inflammatory agents.  Initiate treatment for COVID-19 if patient deteriorates.  #3 hypertension: Continue with blood pressure control.  #4 hyperlipidemia: Continue statin.  #5 tobacco abuse: Continue nicotine patch with tobacco cessation counseling.  Tried hospitalist will assume care of the patient while undergoing cardiology follow-up and work-up.  Thank you for this consultation.  Chief Complaint: Chest pain  HPI:  Patient is a 68 year old gentleman with known history of coronary artery disease including inferior MI in 2019 with occluded RCA and heavily calcified LAD and left circumflex.  Patient's RCA was not engaged.  He also has EF of 45%.  He has been on medical management.  He came to the ER with substernal chest pain that happened suddenly today.  Patient suspected to have unstable angina and was admitted to cardiology service.  As part of general screening for COVID-19 his results came back hours later after admission to be reactive.  Patient is asymptomatic respiratory wise.  No cough no shortness of breath.  No orthopnea or PND.  Cardiology has requested medical consult and assumption of care due to his COVID-19 status.  Review of Systems:  All system  reviewed and negative except per HPI.  Past Medical History:  Diagnosis Date  . Acid reflux   . Anxiety   . Hypertension    Past Surgical History:  Procedure Laterality Date  . LEFT HEART CATH AND CORONARY ANGIOGRAPHY N/A 04/28/2018   Procedure: LEFT HEART CATH AND CORONARY ANGIOGRAPHY;  Surgeon: Jettie Booze, MD;  Location: [facility];  Service: Cardiovascular;  Laterality: N/A;   Social History:  reports that he has been smoking. He has never used smokeless tobacco. He reports current alcohol use of about 6.0 standard drinks of alcohol per week. He reports previous drug use.  No Known Allergies Family History  Problem Relation Age of Onset  . Hypertension Mother 25  . Heart attack Father 23  . Hypertension Sister   . Heart disease Brother     Prior to Admission medications   Medication Sig Start Date End Date Taking? Authorizing Provider  aspirin EC 81 MG tablet Take 81 mg by mouth daily.   Yes [provider]  atorvastatin (LIPITOR) 80 MG tablet Take 1 tablet (80 mg total) by mouth daily at 6 PM. 05/01/18  Yes Duke, Tami Lin, PA  clopidogrel (PLAVIX) 75 MG tablet Take 1 tablet (75 mg total) by mouth daily with breakfast. 05/02/18  Yes Duke, Tami Lin, PA  folic acid (FOLVITE) 1 MG tablet Take 1 mg by mouth daily.   Yes [provider]  isosorbide mononitrate (IMDUR) 30 MG 24 hr tablet Take 30 mg by mouth daily.   Yes [provider]  metoprolol tartrate (LOPRESSOR) 25 MG tablet Take 0.5 tablets (12.5 mg total) by mouth 2 (two) times daily. Patient taking differently:  Take 25 mg by mouth daily.  05/01/18  Yes Duke, Tami Lin, PA  traZODone (DESYREL) 100 MG tablet Take 200 mg by mouth at bedtime.    Yes [provider]  vitamin B-12 (CYANOCOBALAMIN) 1000 MCG tablet Take 1,000 mcg by mouth daily.   Yes [provider]  citalopram (CELEXA) 10 MG tablet Take 10 mg by mouth daily.    [provider]   magnesium oxide (MAG-OX) 400 (241.3 Mg) MG tablet Take 0.5 tablets (200 mg total) by mouth daily. Patient not taking: Reported on 05/06/2019 05/02/18   Ledora Bottcher, PA  nicotine (NICODERM CQ - DOSED IN MG/24 HR) 7 mg/24hr patch Place 1 patch (7 mg total) onto the skin daily. Patient not taking: Reported on 05/06/2019 11/11/18   Daune Perch, NP  nitroGLYCERIN (NITROSTAT) 0.4 MG SL tablet Place 1 tablet (0.4 mg total) under the tongue every 5 (five) minutes x 3 doses as needed for chest pain. 05/01/18   Ledora Bottcher, PA   Physical Exam: Blood pressure (!) 154/95, pulse (!) 52, temperature 98.4 F (36.9 C), temperature source Oral, resp. rate 17, height 5\' 8"  (1.727 m), weight 66.6 kg, SpO2 100 %. Vitals:   05/06/19 2215 05/06/19 2332  BP: 136/85 (!) 154/95  Pulse: 71 (!) 52  Resp: (!) 21 17  Temp:  98.4 F (36.9 C)  SpO2: 100% 100%     General: Stable with no acute distress.  Eyes: PERRLA,  ENT: EOMI,  Neck: Supple, no JVD or lymphadenopathy  Cardiovascular: Regular rate and rhythm  Respiratory: Good air entry bilateral no wheezes rales or crackles  Abdomen: Soft, nontender with positive bowel sounds  Skin: No rashes or ulcers  Musculoskeletal: Normal muscle tones, normal joints  Psychiatric: In good mood,  Neurologic: No focal findings  Labs on Admission:  Basic Metabolic Panel: Recent Labs  Lab 05/06/19 1820 05/06/19 2344 05/07/19 0001  NA 131*  --  133*  K 4.3  --  3.7  CL 98  --  100  CO2 22  --  24  GLUCOSE 86  --  100*  BUN 11  --  9  CREATININE 1.24 1.16 1.11  CALCIUM 9.2  --  9.4   Liver Function Tests: No results for input(s): AST, ALT, ALKPHOS, BILITOT, PROT, ALBUMIN in the last 168 hours. No results for input(s): LIPASE, AMYLASE in the last 168 hours. No results for input(s): AMMONIA in the last 168 hours. CBC: Recent Labs  Lab 05/06/19 1820 05/07/19 0001  WBC 7.0 6.4  HGB 12.6* 14.1  HCT 38.3* 42.4  MCV 82.2 81.7  PLT 210  228   Cardiac Enzymes: No results for input(s): CKTOTAL, CKMB, CKMBINDEX, TROPONINI in the last 168 hours. BNP: Invalid input(s): POCBNP CBG: No results for input(s): GLUCAP in the last 168 hours.  Radiological Exams on Admission: Dg Chest Port 1 View  Result Date: 05/06/2019 CLINICAL DATA:  Chest pain EXAM: PORTABLE CHEST 1 VIEW COMPARISON:  April 28, 2018 FINDINGS: The heart size and mediastinal contours are within normal limits. Aorta is tortuous. Both lungs are clear. The visualized skeletal structures are unremarkable. IMPRESSION: No active disease. Electronically Signed   By: Abelardo Diesel M.D.   On: 05/06/2019 18:20      Time spent: 72 minutes  [facility] Pager [phone]  If 7PM-7AM, please contact night-coverage [email or web address] Password [email or web address] 05/07/2019, 2:52 AM

## 2020-03-28 NOTE — Progress Notes (Signed)
Cardiology Office Note   Date:  03/30/2020   ID:  Darius Patrick, DOB 1951/03/10, MRN [id number]  PCP:  Patient, No Pcp Per    No chief complaint on file.  CAD  Wt Readings from Last 3 Encounters:  03/30/20 142 lb 6.4 oz (64.6 kg)  05/06/19 146 lb 13.2 oz (66.6 kg)  03/22/19 155 lb (70.3 kg)       History of Present Illness: Darius Patrick is a 69 y.o. male  with CAD.  He had an inferior MI on 7/19. RCA could not be engaged and his CP resolved in the cath lab. No PCI was performed.   Cath in 7/19 showed:  Occluded RCA, not selectively visualized. Heavily calcified and could not be engaged. Left to right collaterals.  Mild to moderate heavily calcific CAD in the left system.  LV end diastolic pressure is normal.  There is no aortic valve stenosis.  Thoracic aorta appeared to show high takeoff of RCA. No dissection noted.  Severe tortuosity of the right subclavian artery with moderate atherosclerosis noted.   Recommend uninterrupted dual antiplatelet therapy with Aspirin 81mg  daily and Clopidogrel 75mg  daily for a minimum of 12 months (ACS - Class I recommendation).  Start beta blocker, statin along with aggressive secondary prevention.   Watch in ICU. Heparin for 24-48 hours after sheath pull.  He was treated with IV heparin, DAPT w/ ASA and Plavix, BB and high dose statin therapy.Chest pain resolved. Pt tolerated the procedure well. 2D echo showed normal LVEF at 50-55% with hypokinesis of the inferolateral and inferior myocardium + G2DD.Lipid panel showed LDL at 85 mg/dl.m Also of note, he did havesome nonsustained VT as well as 2-1 AV block in the original post STEMI course, which ultimately resolved. 7/19 echo: Left ventricle: The cavity size was normal. Wall thickness was increased in a pattern of mild LVH. Systolic function was normal. The estimated ejection fraction was in the range of 50% to 55%. There is hypokinesis of the inferolateral and  inferior myocardium. Features are consistent with a pseudonormal left ventricular filling pattern, with concomitant abnormal relaxation and increased filling pressure (grade 2 diastolic dysfunction). - Mitral valve: Calcified annulus. There was mild regurgitation. - Left atrium: The atrium was mildly dilated. - Pericardium, extracardiac: A trivial pericardial effusion was identified.  Impressions:  - Hypokinesis of the basal inferior and inferolateral walls with overall low normal LV systolic function; mild LVH; moderate diastolic dysfunction; mild LAE; mild MR and TR.  Smoking cessation was encouraged.He has cut back.  He decreased alcohol intake a lot after the MI and was eating better. He gained weight.   Darius Patrick presented to [facility] on 10/07/2018 with complaints of chest pressure similar to the discomfort he experienced prior to his MI last year. His EKG showed inferior ST elevation representative of a STEMI or old MI. There were also deep Q waves in the inferior leads. He admitted that since his MI the previous year he had not taken any medications for about a week because of finances. Darius Patrick was taken for left heart cath on 10/17/2018 and found to have chronically occluded RCA and otherwise mild, nonobstructive CAD. LV systolic function was moderately reduced with EF 40% and inferior wall hypokinesis. An attempt was made to wire the RCA, but was unsuccessful. The provider in [facility] noted that on prior cath the year before the patient was noted to have left to right collaterals. He suspected that the patient may  have infarcted his collaterals this time. His hospitalization was complicated by acute kidney injury felt secondary to contrast nephropathy and improved by discharge. ACE inhibitor therapy was put on hold with plan to reinitiate if kidney function stable at follow-up. Troponinwasup to 18.89.Echocardiogram also showed EF of 45% with  inferior hypokinesis.  He had issues with falling after this episode.  Since the last visit, he has done well.  Eats a lot of red meat.  Denies : Chest pain. Dizziness. Leg edema. Nitroglycerin use. Orthopnea. Palpitations. Paroxysmal nocturnal dyspnea. Shortness of breath. Syncope.   He lives alone but has family near by.   Not walking much, mowing the lawn is most strenuous activity.  He carries a grandson.   He got his COVID vaccines. He states he never got COVID, but according to the chart, tested positive in July 2020.    Past Medical History:  Diagnosis Date  . Acid reflux   . Anxiety   . Hypertension     Past Surgical History:  Procedure Laterality Date  . LEFT HEART CATH AND CORONARY ANGIOGRAPHY N/A 04/28/2018   Procedure: LEFT HEART CATH AND CORONARY ANGIOGRAPHY;  Surgeon: Jettie Booze, MD;  Location: [facility];  Service: Cardiovascular;  Laterality: N/A;     Current Outpatient Medications  Medication Sig Dispense Refill  . aspirin EC 81 MG tablet Take 81 mg by mouth daily.    Marland Kitchen atorvastatin (LIPITOR) 80 MG tablet Take 1 tablet (80 mg total) by mouth daily at 6 PM. 90 tablet 3  . citalopram (CELEXA) 10 MG tablet Take 1 tablet (10 mg total) by mouth daily. 30 tablet 0  . clopidogrel (PLAVIX) 75 MG tablet Take 1 tablet (75 mg total) by mouth daily with breakfast. 90 tablet 3  . folic acid (FOLVITE) 1 MG tablet Take 1 mg by mouth daily.    . isosorbide mononitrate (IMDUR) 30 MG 24 hr tablet Take 30 mg by mouth daily.    Marland Kitchen lisinopril (ZESTRIL) 5 MG tablet Take 1 tablet (5 mg total) by mouth daily. 30 tablet 0  . metoprolol tartrate (LOPRESSOR) 25 MG tablet Take 0.5 tablets (12.5 mg total) by mouth 2 (two) times daily. (Patient taking differently: Take 25 mg by mouth daily. ) 180 tablet 3  . nitroGLYCERIN (NITROSTAT) 0.4 MG SL tablet Place 1 tablet (0.4 mg total) under the tongue every 5 (five) minutes x 3 doses as needed for chest pain. 25 tablet 3  .  vitamin B-12 (CYANOCOBALAMIN) 1000 MCG tablet Take 1,000 mcg by mouth daily.     No current facility-administered medications for this visit.    Allergies:   Patient has no known allergies.    Social History:  The patient  reports that he has been smoking. He has never used smokeless tobacco. He reports current alcohol use of about 6.0 standard drinks of alcohol per week. He reports previous drug use.   Family History:  The patient's family history includes Heart attack (age of onset: 3) in his father; Heart disease in his brother; Hypertension in his sister; Hypertension (age of onset: 47) in his mother.    ROS:  Please see the history of present illness.   Otherwise, review of systems are positive for erectile dysfunction.   All other systems are reviewed and negative.    PHYSICAL EXAM: VS:  BP 118/74   Pulse 60   Ht 5\' 8"  (1.727 m)   Wt 142 lb 6.4 oz (64.6 kg)   SpO2 95%  BMI 21.65 kg/m  , BMI Body mass index is 21.65 kg/m. GEN: Well nourished, well developed, in no acute distress  HEENT: normal  Neck: no JVD, carotid bruits, or masses Cardiac: RRR; no murmurs, rubs, or gallops,no edema  Respiratory:  clear to auscultation bilaterally, normal work of breathing GI: soft, nontender, nondistended, + BS MS: no deformity or atrophy  Skin: warm and dry, no rash Neuro:  Strength and sensation are intact Psych: euthymic mood, full affect   EKG:   The ekg ordered today demonstrates NSR, inferior Q waves, nonspecific ST changes   Recent Labs: 05/07/2019: BUN 9; Creatinine, Ser 1.11; Hemoglobin 14.1; Platelets 228; Potassium 3.7; Sodium 133   Lipid Panel    Component Value Date/Time   CHOL 89 05/06/2019 2344   CHOL 111 12/06/2018 0750   TRIG 42 05/06/2019 2344   HDL 43 05/06/2019 2344   HDL 59 12/06/2018 0750   CHOLHDL 2.1 05/06/2019 2344   VLDL 8 05/06/2019 2344   LDLCALC 38 05/06/2019 2344   LDLCALC 43 12/06/2018 0750     Other studies Reviewed: Additional  studies/ records that were reviewed today with results demonstrating: PMD labs reviewed.   ASSESSMENT AND PLAN:  1. CAD/Old MI: No bleeding issues.  Stop aspirin , continue clopidogrel monotherapy.   2. Smoking: Still smoking.  He has cut back.  He is willing to try a patch.  Will try 14 mg Nicotine patch.  F/u with PMD in a few weeks. 3. Hyperlipidemia: LDL 38 in July 2020.  TO be checked with Dr. Oswaldo Milian.   4. HTN: The current medical regimen is effective;  continue present plan and medications. 5. Asked about Viagra, but this is not compatible with Imdur.  6. Prior contrast-induced nephropathy when he had an angiogram at [facility].    Current medicines are reviewed at length with the patient today.  The patient concerns regarding his medicines were addressed.  The following changes have been made: Stop aspirin, try nicotine patches 14 mg  Labs/ tests ordered today include: To be checked by PMD No orders of the defined types were placed in this encounter.   Recommend 150 minutes/week of aerobic exercise Low fat, low carb, high fiber diet recommended  Disposition:   FU in 1 year   Signed, Larae Grooms, MD  03/30/2020 8:57 AM    [facility] [address], [place], [place]  [address] Phone: [phone]; Fax: [phone]

## 2020-03-30 ENCOUNTER — Encounter: Payer: Self-pay | Admitting: Interventional Cardiology

## 2020-03-30 ENCOUNTER — Ambulatory Visit (INDEPENDENT_AMBULATORY_CARE_PROVIDER_SITE_OTHER): Payer: Medicare Other | Admitting: Interventional Cardiology

## 2020-03-30 ENCOUNTER — Telehealth: Payer: Self-pay | Admitting: Interventional Cardiology

## 2020-03-30 ENCOUNTER — Other Ambulatory Visit: Payer: Self-pay

## 2020-03-30 VITALS — BP 118/74 | HR 60 | Ht 68.0 in | Wt 142.4 lb

## 2020-03-30 DIAGNOSIS — E785 Hyperlipidemia, unspecified: Secondary | ICD-10-CM

## 2020-03-30 DIAGNOSIS — I252 Old myocardial infarction: Secondary | ICD-10-CM

## 2020-03-30 DIAGNOSIS — I10 Essential (primary) hypertension: Secondary | ICD-10-CM

## 2020-03-30 DIAGNOSIS — I25118 Atherosclerotic heart disease of native coronary artery with other forms of angina pectoris: Secondary | ICD-10-CM

## 2020-03-30 DIAGNOSIS — F17200 Nicotine dependence, unspecified, uncomplicated: Secondary | ICD-10-CM

## 2020-03-30 MED ORDER — NICOTINE 14 MG/24HR TD PT24: 14.0000 mg | MEDICATED_PATCH | Freq: Every day | TRANSDERMAL | 0 refills | Status: AC

## 2020-03-30 NOTE — Telephone Encounter (Signed)
Pt is requesting a refill on folic acid and vitamin B-12. Would Dr. Irish Lack like to refill these medications? Please address

## 2020-03-30 NOTE — Patient Instructions (Signed)
Medication Instructions:  Your physician has recommended you make the following change in your medication:   1. STOP: Aspirin  2. START: nicoderm patch 14 mg: Use as directed  *If you need a refill on your cardiac medications before your next appointment, please call your pharmacy*   Lab Work: None  If you have labs (blood work) drawn today and your tests are completely normal, you will receive your results only by: Marland Kitchen MyChart Message (if you have MyChart) OR . A paper copy in the mail If you have any lab test that is abnormal or we need to change your treatment, we will call you to review the results.   Testing/Procedures: None   Follow-Up: At [facility], you and your health needs are our priority.  As part of our continuing mission to provide you with exceptional heart care, we have created designated Provider Care Teams.  These Care Teams include your primary Cardiologist (physician) and Advanced Practice Providers (APPs -  Physician Assistants and Nurse Practitioners) who all work together to provide you with the care you need, when you need it.  We recommend signing up for the patient portal called "MyChart".  Sign up information is provided on this After Visit Summary.  MyChart is used to connect with patients for Virtual Visits (Telemedicine).  Patients are able to view lab/test results, encounter notes, upcoming appointments, etc.  Non-urgent messages can be sent to your provider as well.   To learn more about what you can do with MyChart, go to [email or web address].    Your next appointment:   12 month(s)  The format for your next appointment:   In Person  Provider:   You may see Larae Grooms, MD or one of the following Advanced Practice Providers on your designated Care Team:    Melina Copa, PA-C  Ermalinda Barrios, PA-C    Other Instructions None

## 2020-03-30 NOTE — Telephone Encounter (Signed)
Defer to PCP

## 2020-03-30 NOTE — Telephone Encounter (Signed)
°*  STAT* If patient is at the pharmacy, call can be transferred to refill team.   1. Which medications need to be refilled? (please list name of each medication and dose if known)  folic acid (FOLVITE) 1 MG tablet  vitamin B-12 (CYANOCOBALAMIN) 1000 MCG tablet   2. Which pharmacy/location (including street and city if local pharmacy) is medication to be sent to? [id number] - [place], [place] - [address] AT [address]  3. Do they need a 30 day or 90 day supply? [place]

## 2020-03-30 NOTE — Telephone Encounter (Signed)
I tried calling the pt, but pt does not have a VM.

## 2020-08-06 DEATH — deceased

## 2021-03-03 IMAGING — DX PORTABLE CHEST - 1 VIEW
1 series · 1 of 1 positions shown · non-contrast
Comparison: April 28, 2018

CLINICAL DATA: Chest pain

EXAM:
PORTABLE CHEST 1 VIEW

[chest ap]
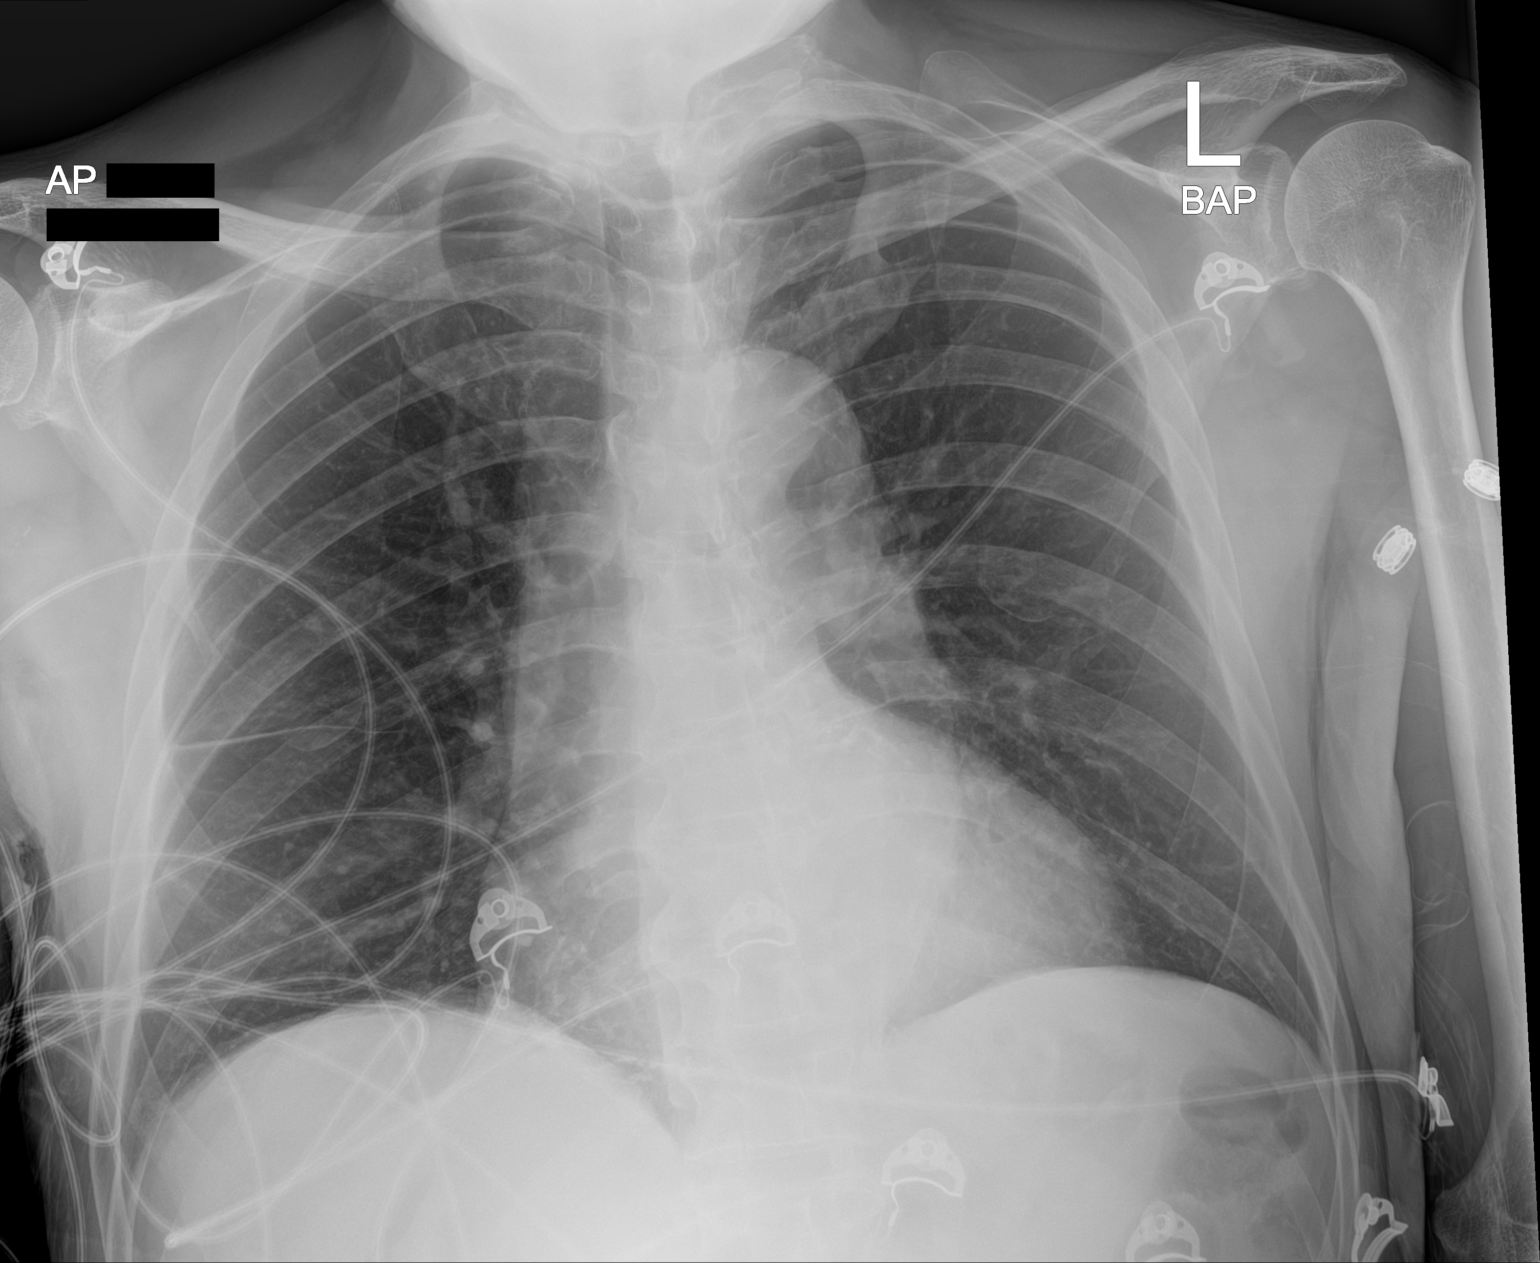

[1 of 1 positions shown; findings below may reference images not displayed]

FINDINGS: The heart size and mediastinal contours are within normal limits.
Aorta is tortuous. Both lungs are clear. The visualized skeletal
structures are unremarkable.
IMPRESSION: No active disease.
